# Patient Record
Sex: Male | Born: 1990 | Race: Black or African American | Hispanic: No | Marital: Single | Smoking: Current every day smoker
Health system: Southern US, Community
[De-identification: ages and names within clinical notes are randomized; demographics above are authoritative.]

## PROBLEM LIST (undated history)

## (undated) DIAGNOSIS — F909 Attention-deficit hyperactivity disorder, unspecified type: Secondary | ICD-10-CM

## (undated) DIAGNOSIS — F32A Depression, unspecified: Secondary | ICD-10-CM

## (undated) DIAGNOSIS — F319 Bipolar disorder, unspecified: Secondary | ICD-10-CM

## (undated) DIAGNOSIS — F419 Anxiety disorder, unspecified: Secondary | ICD-10-CM

## (undated) DIAGNOSIS — F329 Major depressive disorder, single episode, unspecified: Secondary | ICD-10-CM

## (undated) HISTORY — PX: WRIST SURGERY: SHX841

## (undated) HISTORY — PX: HAND TENDON SURGERY: SHX663

---
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
## 2009-12-10 ENCOUNTER — Emergency Department (HOSPITAL_COMMUNITY): Admission: EM | Admit: 2009-12-10 | Discharge: 2009-12-10 | Payer: Self-pay | Admitting: Emergency Medicine

## 2010-11-12 NOTE — H&P (Signed)
[facility]  Patient:    Connor Weber, Connor Weber                         MRN: [id number] Adm. Date:  [id number] Attending:  Veneta Penton                   Psychiatric Admission Assessment  DATE OF ADMISSION:  March 24, 2000  PATIENT IDENTIFICATION:  This [age]-year-old black male was admitted because of increased symptoms of depression over the past six months, along with increasing auditory hallucinations telling him to kill himself.  HISTORY OF PRESENT ILLNESS:  The patient reports that he hears the voice of the devil telling him that he is going to die or that he should go ahead and kill himself.  He reports that he is having increasing difficulty preventing himself from acting on those voices.  He admits to an increasingly depressed and irritable mood most of the day nearly every day.  He has been having increasingly assaultive behaviors towards siblings and towards his teachers at school.  He has been unable to perform at school.  His grades have been decreasing.  At home, he is completely unmanageable according to his mother. He admits to anhedonia, decreased appetite, feelings of hopelessness, helplessness, and worthlessness, initial and terminal insomnia, decreased concentration and energy level, increased symptoms of of fatigue, anxiety, psychomotor agitation, recurrent thoughts of death.  He denies any homicidal ideation, symptoms of mania.  He denies any other olfactory, tactile or visual hallucinations.  He denies any obsessions, compulsions, panic attacks, alcohol or drug abuse.  PAST PSYCHIATRIC HISTORY:  Significant for his being admitted to [facility] in 1999 with a supposed diagnosis of bipolar disorder, although the patient does not appear to meet criteria for that disorder.  He has a history of oppositional-defiant disorder and possibly conduct disorder.  There is no other psychiatric history available at this time.  His  medications at the time of admission include Adderall 10 mg p.o. b.i.d. and Depakote 500 mg p.o. b.i.d.  PAST MEDICAL HISTORY:  He has no known drug allergies or sensitivities.  THere is no other history of medical or surgical problems.  MENTAL STATUS EXAMINATION:  The patient presents as a well developed, well nourished black latency-age male who is alert, oriented x 4, responding to internal stimuli, psychomotor agitated, tremulous, with poor impulse control, decreased concentration and attention span.  His affect and mood are depressed and irritable.  His speech is coherent with a decreased rate and volume, his speech increase speech latency.  His immediate recall, short term memory and remote memory are intact.  His thought processes appear to be influenced by auditory hallucinations.  FAMILY AND SOCIAL HISTORY:  Significant for paternal grandmother, paternal aunt, and biological father having a history of schizophrenia.  Mother and stepfather are in the process of divorcing right now, but mother reports she does not feel the child is being significantly affected by this.  There is no other family history available at this time.  ADMISSION DIAGNOSES: Axis I:    1. Major depression, recurrent type, severe, with mood congruent               psychosis.            2. Attention deficit hyperactivity disorder, combined type.            3. Bipolar disorder, unlikely.  4. Rule out schizoaffective disorder.            5. Oppositional-defiant disorder.            6. Rule out conduct disorder. Axis II:   Rule out learning disorder not otherwise specified. Axis III:  None. Axis IV:   Severe. Axis V:    Code 20.  ASSETS AND STRENGTHS:  He has a supportive mother.  INITIAL PLAN OF CARE:  Discontinued Depakote and Adderall, begin the patient on a trial of Dexedrine spansules for his ADHD, and a trial of Remeron for depression.   Psychotherapy will focus on decreasing the  patients cognitive distortions, increasing his reality testing, decreasing potential for harm to self and others.  A laboratory work-up will also be initiated to rule out any medical problems contributing to his symptomatology.  ESTIMATED LENGTH OF STAY:  Five to seven days.  POST HOSPITAL CARE PLAN:  Initial discharge plan is to discharge the patient to home. DD:  03/24/00 TD:  03/24/00 Job: [id number]

## 2010-11-12 NOTE — Discharge Summary (Signed)
[facility]  Patient:    Connor Weber, PERALES                           MRN: [id number] Adm. Date:  [id number] Disc. Date: [id number] Attending:  Veneta Penton                           Discharge Summary  REASON FOR ADMISSION:  This 20 year old black male was admitted after tying an an electrical cord around his neck and attempting to hang himself.  For further History of Present Illness, please see the patients Psychiatric Admission Assessment.  PHYSICAL EXAMINATION:  His physical examination at the time of admission was entirely unremarkable.  LABORATORY DATA:  The patient underwent a laboratory workup to rule out any medical problems contributing to this symptomatology.  A urine drug screen was negative.  GGT was within normal limits.  A hepatic panel was unremarkable. CBC showed a hemoglobin of 10.6, hematocrit of 31.6, and was otherwise unremarkable.  A routine chemistry panel was within normal limits.  Thyroid function tests were within normal limits.  A UA was unremarkable.  The patient received no x-rays, no special procedures.  CONSULTATIONS:  None.  COMPLICATIONS:  He sustained no complications during the course of this hospitalization.  HOSPITAL COURSE:  The patient on admission was hyperactive, oppositional, defiant, irritable, angry, and depressed.  He was assaultive and aggressive at times and needed constant redirection within the milieu.  He was begun on a trial of Dexedrine spansules in combination with Effexor XR and Remeron.  He tolerated these medications well without any side effects.  At the time of discharge he denies any homicidal or suicidal ideation; affect and mood have improved; his concentration, attention span, and impulse control have improved both on the unit in group activities and in the classroom.  He denies any desire to harm himself or others.  As he is participating in all aspects of the therapeutic treatment  program, it is felt the patient has reached his maximum benefits of hospitalization and is ready for discharge to a less restrictive alternative setting.  CONDITION ON DISCHARGE:  Improved.  DISCHARGE DIAGNOSES ACCORDING TO DSM4: Axis I:    1. Major depression, recurrent type, severe without psychosis.            2. Attention deficit hyperactivity disorder, combined type.            3. Conduct disorder. Axis II:   Rule out learning disorder, not otherwise specified. Axis III:  None. Axis IV:   Severe. Axis V:    Code 10-20 on admission; code 30 on discharge.  FURTHER EVALUATION AND TREATMENT RECOMMENDATIONS: 1. The patient is discharged to the custody of his D.S.S. worker. 2. The patient is discharged on an unrestricted level of activity and a    regular diet.  DISCHARGE MEDICATIONS:  The patient is discharged on: 1. Remeron SolTabs 30 mg p.o. q.h.s. 2. Effexor XR 75 mg p.o. q.a.m. 3. Dexedrine spansules 30 mg p.o. q.a.m.  DISCHARGE FOLLOWUP:  He will follow up with Dr. Marlou Porch at [facility] for all further aspects of his psychiatric care, and consequently, I will sign off on the case at this time. DD:  07/11/00 TD:  07/11/00 Job: [id number]

## 2010-11-12 NOTE — Discharge Summary (Signed)
[facility]  Patient:    Connor Weber, Connor Weber                         MRN: [id number] Adm. Date:  [id number] Disc. Date: 03/31/00 Attending:  Veneta Penton                           Discharge Summary  REASON FOR ADMISSION:  This 20-year-old black male was admitted because of increasing symptoms of depression over the past six months along with increasing auditory hallucinations telling him to kill himself.  For further history of present illness, please see the patients psychiatric admission assessment.  His physical examination at the time of admission was entirely unremarkable.  LABORATORY EXAMINATION:  The patient underwent a laboratory work-up to rule out any medical problems contributing to his symptomatology.  A path panel was unremarkable.  UA was unremarkable.  Metabolic panel was within normal limits. A valproic acid level on admission was 91.2.  CBC showed a hemoglobin of 10.4, hematocrit of 31.1.  RDW of 14.2%.  Eosinophil count of 6%.  Thyroid function tests were within normal limits.  His ______ was within normal limits.  No x-rays, additional consultations or special procedures were performed.  The patient sustained no complications over the course of his hospitalization.  HOSPITAL COURSE:  The patient slowly adapted to unit routine.  He initially was responding to auditory hallucinations.  He was taken off of Depakote and Adderall due to the possibility that these might be contributing to his hallucinatory phenomena.  He was begun on a trial of Remeron at 30 mg p.o. q.h.s. for his depressive symptoms as well as a trial of Effexor.  These were titrated up to a therapeutic dosage and the patient responded well to them. For his ADHD symptoms the patient was switched to Dexedrine Spansules.  At the present time he is reporting no hallucinations.  He denies any homicidal or suicidal ideation.  His affect and mood is improved.  He reports  feeling better.  He is participating in all aspects of a therapeutic treatment program.  His activities of daily living have also improved.  Consequently, it is felt that the patient has reached his maximum benefits of hospitalization. He was also demonstrating a significant problem with attention deficit hyperactivity disorder that has also been improved on medication.  He denies any symptoms of psychosis or auditory or visual hallucinations at the present time.  CONDITION ON DISCHARGE:  Improved.  DIAGNOSES:  His diagnoses according to DSM-IV: Axis I:    1. Major depression, recurrent type severe with mood congruent               psychosis.            2. Attention deficit hyperactivity disorder, combined type.            3. Oppositional defiant disorder.            4. Rule out conduct disorder. Axis II:   Rule out learning disorder, not otherwise specified. Axis III:  None. Axis IV:   Severe. Axis V:    Code 20 on admission, code 30 on discharge.  FURTHER EVALUATION AND TREATMENT RECOMMENDATIONS: 1. The patient is discharged to home. 2. The patient is discharged to the custody of his parents. 3. The patient will follow up with his outpatient therapist, psychiatrist    for individual and family therapy and  all further aspects of his mental    health care including medication management.  DISCHARGE MEDICATIONS:  The patient is discharged on the following medications: 1. Dexedrine Spansules 30 mg p.o. q.a.m. 2. Remeron 30 mg p.o. q.h.s. 3. Effexor XR 75 mg p.o. q.a.m.  ACTIVITY/DIET:  The patient is discharged on an unrestricted level of activity and a regular diet. DD:  03/31/00 TD:  03/31/00 Job: [id number]
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
## 2010-11-16 ENCOUNTER — Emergency Department (HOSPITAL_COMMUNITY)
Admission: EM | Admit: 2010-11-16 | Discharge: 2010-11-16 | Disposition: A | Discharge status: Home / Self Care | Payer: Medicaid Other | Attending: Emergency Medicine | Admitting: Emergency Medicine

## 2010-11-16 DIAGNOSIS — R197 Diarrhea, unspecified: Secondary | ICD-10-CM | POA: Insufficient documentation

## 2010-11-16 DIAGNOSIS — F988 Other specified behavioral and emotional disorders with onset usually occurring in childhood and adolescence: Secondary | ICD-10-CM | POA: Insufficient documentation

## 2010-11-16 DIAGNOSIS — R112 Nausea with vomiting, unspecified: Secondary | ICD-10-CM | POA: Insufficient documentation

## 2010-11-16 DIAGNOSIS — F319 Bipolar disorder, unspecified: Secondary | ICD-10-CM | POA: Insufficient documentation

## 2010-11-27 ENCOUNTER — Inpatient Hospital Stay (INDEPENDENT_AMBULATORY_CARE_PROVIDER_SITE_OTHER)
Admission: RE | Admit: 2010-11-27 | Discharge: 2010-11-27 | Disposition: A | Discharge status: Home / Self Care | Payer: Medicaid Other | Source: Ambulatory Visit | Attending: Family Medicine | Admitting: Family Medicine

## 2010-11-27 DIAGNOSIS — R197 Diarrhea, unspecified: Secondary | ICD-10-CM

## 2010-11-27 DIAGNOSIS — R10819 Abdominal tenderness, unspecified site: Secondary | ICD-10-CM

## 2010-11-27 LAB — POCT I-STAT, CHEM 8
Calcium, Ion: 1.22 mmol/L (ref 1.12–1.32)
Chloride: 104 mEq/L (ref 96–112)
Creatinine, Ser: 1.3 mg/dL (ref 0.4–1.5)
Hemoglobin: 17.3 g/dL — ABNORMAL HIGH (ref 13.0–17.0)
Potassium: 4 mEq/L (ref 3.5–5.1)
Sodium: 140 mEq/L (ref 135–145)

## 2011-02-14 ENCOUNTER — Emergency Department (HOSPITAL_COMMUNITY): Payer: Medicaid Other

## 2011-02-14 ENCOUNTER — Emergency Department (HOSPITAL_COMMUNITY)
Admission: EM | Admit: 2011-02-14 | Discharge: 2011-02-14 | Disposition: A | Discharge status: Home / Self Care | Payer: Medicaid Other | Attending: Emergency Medicine | Admitting: Emergency Medicine

## 2011-02-14 DIAGNOSIS — M25539 Pain in unspecified wrist: Secondary | ICD-10-CM | POA: Insufficient documentation

## 2011-02-14 DIAGNOSIS — S59909A Unspecified injury of unspecified elbow, initial encounter: Secondary | ICD-10-CM | POA: Insufficient documentation

## 2011-02-14 DIAGNOSIS — S6990XA Unspecified injury of unspecified wrist, hand and finger(s), initial encounter: Secondary | ICD-10-CM | POA: Insufficient documentation

## 2011-02-14 DIAGNOSIS — S59919A Unspecified injury of unspecified forearm, initial encounter: Secondary | ICD-10-CM | POA: Insufficient documentation

## 2011-02-14 DIAGNOSIS — M21939 Unspecified acquired deformity of unspecified forearm: Secondary | ICD-10-CM | POA: Insufficient documentation

## 2011-02-14 DIAGNOSIS — M25439 Effusion, unspecified wrist: Secondary | ICD-10-CM | POA: Insufficient documentation

## 2011-02-14 DIAGNOSIS — F319 Bipolar disorder, unspecified: Secondary | ICD-10-CM | POA: Insufficient documentation

## 2011-02-14 DIAGNOSIS — S52599A Other fractures of lower end of unspecified radius, initial encounter for closed fracture: Secondary | ICD-10-CM | POA: Insufficient documentation

## 2011-02-14 DIAGNOSIS — F988 Other specified behavioral and emotional disorders with onset usually occurring in childhood and adolescence: Secondary | ICD-10-CM | POA: Insufficient documentation

## 2011-12-04 ENCOUNTER — Emergency Department (HOSPITAL_COMMUNITY): Payer: Medicaid Other

## 2011-12-04 ENCOUNTER — Encounter (HOSPITAL_COMMUNITY): Payer: Self-pay | Admitting: Emergency Medicine

## 2011-12-04 ENCOUNTER — Emergency Department (HOSPITAL_COMMUNITY)
Admission: EM | Admit: 2011-12-04 | Discharge: 2011-12-04 | Disposition: A | Discharge status: Home / Self Care | Payer: Medicaid Other | Attending: Emergency Medicine | Admitting: Emergency Medicine

## 2011-12-04 DIAGNOSIS — W219XXA Striking against or struck by unspecified sports equipment, initial encounter: Secondary | ICD-10-CM | POA: Insufficient documentation

## 2011-12-04 DIAGNOSIS — S8010XA Contusion of unspecified lower leg, initial encounter: Secondary | ICD-10-CM | POA: Insufficient documentation

## 2011-12-04 DIAGNOSIS — F17200 Nicotine dependence, unspecified, uncomplicated: Secondary | ICD-10-CM | POA: Insufficient documentation

## 2011-12-04 DIAGNOSIS — Y998 Other external cause status: Secondary | ICD-10-CM | POA: Insufficient documentation

## 2011-12-04 DIAGNOSIS — Y9361 Activity, american tackle football: Secondary | ICD-10-CM | POA: Insufficient documentation

## 2011-12-04 NOTE — Discharge Instructions (Signed)
Use Advil 400 mg 3 times a day for pain. Use the ace wrap for comfort. See the Dr. of your choice for problems.    Bone Bruise  A bone bruise is a small hidden fracture of the bone. It typically occurs with bones located close to the surface of the skin.  SYMPTOMS  The pain lasts longer than a normal bruise.   The bruised area is difficult to use.   There may be discoloration or swelling of the bruised area.   When a bone bruise is found with injury to the anterior cruciate ligament (in the knee) there is often an increased:   Amount of fluid in the knee   Time the fluid in the knee lasts.   Number of days until you are walking normally and regaining the motion you had before the injury.   Number of days with pain from the injury.  DIAGNOSIS  It can only be seen on X-rays known as MRIs. This stands for magnetic resonance imaging. A regular X-ray taken of a bone bruise would appear to be normal. A bone bruise is a common injury in the knee and the heel bone (calcaneus). The problems are similar to those produced by stress fractures, which are bone injuries caused by overuse. A bone bruise may also be a sign of other injuries. For example, bone bruises are commonly found where an anterior cruciate ligament (ACL) in the knee has been pulled away from the bone (ruptured). A ligament is a tough fibrous material that connects bones together to make our joints stable. Bruises of the bone last a lot longer than bruises of the muscle or tissues beneath the skin. Bone bruises can last from days to months and are often more severe and painful than other bruises. TREATMENT Because bone bruises are sudden injuries you cannot often prevent them, other than by being extremely careful. Some things you can do to improve the condition are:  Apply ice to the sore area for 15 to 20 minutes, 3 to 4 times per day while awake for the first 2 days. Put the ice in a plastic bag, and place a towel between the  bag of ice and your skin.   Keep your bruised area raised (elevated) when possible to lessen swelling.   For activity:   Use crutches when necessary; do not put weight on the injured leg until you are no longer tender.   You may walk on your affected part as the pain allows, or as instructed.   Start weight bearing gradually on the bruised part.   Continue to use crutches or a cane until you can stand without causing pain, or as instructed.   If a plaster splint was applied, wear the splint until you are seen for a follow-up examination. Rest it on nothing harder than a pillow the first 24 hours. Do not put weight on it. Do not get it wet. You may take it off to take a shower or bath.   If an air splint was applied, more air may be blown into or out of the splint as needed for comfort. You may take it off at night and to take a shower or bath.   Wiggle your toes in the splint several times per day if you are able.   You may have been given an elastic bandage to use with the plaster splint or alone. The splint is too tight if you have numbness, tingling or if your foot becomes  cold and blue. Adjust the bandage to make it comfortable.   Only take over-the-counter or prescription medicines for pain, discomfort, or fever as directed by your caregiver.   Follow all instructions for follow up with your caregiver. This includes any orthopedic referrals, physical therapy, and rehabilitation. Any delay in obtaining necessary care could result in a delay or failure of the bones to heal.  SEEK MEDICAL CARE IF:   You have an increase in bruising, swelling, or pain.   You notice coldness of your toes.   You do not get pain relief with medications.  SEEK IMMEDIATE MEDICAL CARE IF:   Your toes are numb or blue.   You have severe pain not controlled with medications.   If any of the problems that caused you to seek care are becoming worse.  Document Released: 09/03/2003 Document Revised:  06/02/2011 Document Reviewed: 01/16/2008 [facility] Patient Information 2012 [place], [place].   RESOURCE GUIDE  Chronic Pain Problems: Contact [facility]  [phone] Patients need to be referred by their primary care doctor.  Insufficient Money for Medicine: Contact United Way:  call "211" or [facility] 781-139-7997.  No Primary Care Doctor: - Call [facility] Connect  [phone] - can help you locate a primary care doctor that  accepts your insurance, provides certain services, etc. - Physician Referral Service- [phone]  Agencies that provide inexpensive medical care: - [facility]  [phone] - [facility]  [phone] - [facility]  [phone] - Women's Clinic  [phone] - Planned Parenthood  [phone] [facility]  [phone]  Medicaid-accepting [facility] Providers: - [facility]- [address]  [phone], Mon-Fri 9am-7pm, Sat 9am-1pm - [facility]- [address] [place], [address] [place]  [phone] - [facility]- [address], [address] [place]  [phone] [facility]- [address]  [phone] - Renaye Rakers- [address] [place], [address], [phone]  Only accepts [place] patients after they have their name  applied to their card  Self Pay (no insurance) in [place]: - Sickle Cell Patients: Dr Willey Blade, [facility]  [address] [place], [phone] - [facility]- [address] [place]  [phone]       [facility] [place]- [address]       -     [facility]- see information above (Speak to [employer] if you do not have insurance)       -  [facility]- [address] [place], [phone]       -  [facility]- [address],  [phone]       -  [facility]- [address] [phone]       -  Dr  Julio Sicks-  [address], [address], [place], [phone]       -  [facility]- [address], [phone]       -  [facility]- [address] [phone], also [address], [phone]       -    [facility]- [address] [place], [phone], 1st & 3rd Saturday   every month, 10am-1pm  1) Find a Doctor and Pay Out of Pocket Although you won't have to find  out who is covered by your insurance plan, it is a good idea to ask around and get recommendations. You will then need to call the office and see if the doctor you have chosen will accept you as a new patient and what types of options they offer for patients who are self-pay. Some doctors offer discounts or will set up payment plans for their patients who do not have insurance, but you will need to ask so you aren't surprised when you get to your appointment.  2) Contact Your Local Health Department Not all health departments have doctors that can see patients for sick visits, but many do, so it is worth a call to see if yours does. If you don't know where your local health department is, you can check in your phone book. The CDC also has a tool to help you locate your state's health department, and many state websites also have listings of all of their local health departments.  3) Find a Walk-in Clinic If your illness is not likely to be very severe or complicated, you may want to try a walk in clinic. These are popping up all over the country in pharmacies, drugstores, and shopping centers. They're usually staffed by nurse practitioners or physician assistants that have been trained to treat common illnesses and complaints. They're usually fairly quick and inexpensive. However, if you have serious medical issues or chronic medical problems, these are probably not your best option  STD Testing - [facility] Department of [facility], STD Clinic, [address] [place], phone  [phone] or [phone].  Monday - Friday, call for an appointment. [facility] Department of [employer], STD Clinic, [address], [place], phone [phone] or [phone].  Monday - Friday, call for an appointment.  Abuse/Neglect: [facility] Child Abuse Hotline 873-340-0169 [facility] Child Abuse Hotline 2230258400 (After Hours)  Emergency Shelter:  [facility] (226)398-6977  Maternity Homes: - Room at the [place] [facility] [phone] - [facility] ([phone]  MRSA Hotline #:   214 409 6320  [facility] Resources  [facility] [place]  United Way [facility] Dept. [address]                 [address]         [address] [place] [address]  Blondell Reveal Phone:  [phone]                                  Phone:  [phone]                   Phone:  [phone]  [facility] Mental Health, [phone] - [phone]       -     [facility] in [place], [address],  [phone], [facility] Child Abuse Hotline 408-478-8717 or (657) 231-6506 (After Hours)   Behavioral Health Services  Substance Abuse Resources: - [facility] Drug Services  850-376-7022 - [facility] 567-398-6465 - [facility] [place] [phone] Floydene Flock [phone] - Residential & Outpatient Substance Abuse Program  [phone]  Psychological Services: [facility]  [phone] [facility] Services  [phone] - [facility], [phone] [place]. [address], [place], ACCESS LINE: [phone] or 220-184-1902, [employer]  Dental Assistance  If unable to pay or uninsured, contact:  [facility] or [facility]. to become qualified for the adult dental clinic.  Patients with Medicaid: [facility] [phone] [address], [phone] [address] [phone]  If unable to pay, or uninsured, contact [facility] [phone]) or [facility] Department [phone] in [place], [phone] in [facility]) to become qualified for the adult dental clinic  Other Low-Cost Community Dental Services: - [facility]- [address] [place], [place], [place], [address], [phone], [phone], 2nd and 4th Thursday of the month at 6:30am.  10 clients each day by appointment, can sometimes see walk-in patients if someone does not show for an appointment. [facility]- [address] [place], [place], [address], [phone] - [facility]- [address] [place], [place], [address], [phone] - [facility]- [phone] [facility] Department- [phone] [facility] Department- [phone]

## 2011-12-04 NOTE — ED Notes (Signed)
Pt c/o right ankle/shin pain onset Friday while playing football.

## 2011-12-04 NOTE — ED Provider Notes (Signed)
History   This chart was scribed for Flint Melter, MD scribed by Magnus Sinning. The patient was seen in room STRE3/STRE3 seen at 11:52    CSN: [id number]  Arrival date & time 12/04/11  1021   First MD Initiated Contact with Patient 12/04/11 1112      Chief Complaint  Patient presents with  . Leg Pain    right leg    (Consider location/radiation/quality/duration/timing/severity/associated sxs/prior treatment) HPI Connor Weber is a 21 y.o. male who presents to the Emergency Department complaining of constant moderate ankle pain, onset 3 days. Pt says he injured his ankle playing football, detailing that someone hit it. Patient states that he is ambulatory with pain and that only treatment tried was a bath salt soak with no relief. Pt says when he got up this morning and stood up he experienced a shooting pain into leg, which prompted him to come be seen in the ED.  History reviewed. No pertinent past medical history.  Past Surgical History  Procedure Date  . Wrist surgery     History reviewed. No pertinent family history.  History  Substance Use Topics  . Smoking status: Current Everyday Smoker  . Smokeless tobacco: Not on file  . Alcohol Use: Yes      Review of Systems  Allergies  Review of patient's allergies indicates no known allergies.  Home Medications   Current Outpatient Rx  Name Route Sig Dispense Refill  . LISDEXAMFETAMINE DIMESYLATE 30 MG PO CAPS Oral Take 30 mg by mouth daily as needed. When angry to calm down    . PRESCRIPTION MEDICATION Oral Take 1 tablet by mouth daily. For adhd and bipolar ( on 2 different meds)      BP 120/67  Pulse 67  Temp(Src) 97.7 F (36.5 C) (Oral)  Resp 14  SpO2 100%  Physical Exam  Nursing note and vitals reviewed. Constitutional: He is oriented to person, place, and time. He appears well-developed and well-nourished. No distress.  HENT:  Head: Normocephalic and atraumatic.  Eyes: Conjunctivae and EOM are normal.    Neck: Neck supple. No tracheal deviation present.  Cardiovascular: Normal rate.   Pulmonary/Chest: Effort normal. No respiratory distress.  Abdominal: He exhibits no distension.  Musculoskeletal: Normal range of motion. He exhibits no edema.       Tenderness of the distal right tibia about 10 cm above the ankle. No redness swelling or deformity. Intact distal sensation and circulation.   Neurological: He is alert and oriented to person, place, and time. No sensory deficit.  Skin: Skin is warm and dry.  Psychiatric: He has a normal mood and affect. His behavior is normal.    ED Course  Procedures (including critical care time) DIAGNOSTIC STUDIES: Oxygen Saturation is 100% on room air, normal by my interpretation.    COORDINATION OF CARE: Ace wrap given in emergency    Labs Reviewed - No data to display Dg Tibia/fibula Right  12/04/2011  *RADIOLOGY REPORT*  Clinical Data: Injured right leg 2 days ago playing football.  Pain distal tibia-fibula.  RIGHT TIBIA AND FIBULA - 2 VIEW  Comparison: None.  Findings: There is some soft tissue swelling anteriorly at the level of the lower third of the distal tibia and fibula.  No acute or healing fracture is identified.  IMPRESSION: Mild soft tissue swelling anterior to the lower tibia-fibula.  No acute bony abnormality.  Original Report Authenticated By: Britta Mccreedy, M.D.     1. Contusion, lower leg  MDM  Evaluation is consistent with bone contusion, doubt sprain, fracture, or neuropathy.   I personally performed the services described in this documentation, which was scribed in my presence. The recorded information has been reviewed and considered.      Plan: Home Medications- Advil prn; Home Treatments- ACE for comfort; Recommended follow up- PCP prn    Flint Melter, MD 12/04/11 [phone]

## 2011-12-13 ENCOUNTER — Encounter (HOSPITAL_COMMUNITY): Payer: Self-pay | Admitting: Emergency Medicine

## 2011-12-13 ENCOUNTER — Emergency Department (HOSPITAL_COMMUNITY)
Admission: EM | Admit: 2011-12-13 | Discharge: 2011-12-13 | Disposition: A | Discharge status: Home / Self Care | Payer: Medicaid Other | Source: Home / Self Care | Attending: Emergency Medicine | Admitting: Emergency Medicine

## 2011-12-13 DIAGNOSIS — Z2089 Contact with and (suspected) exposure to other communicable diseases: Secondary | ICD-10-CM

## 2011-12-13 DIAGNOSIS — Z202 Contact with and (suspected) exposure to infections with a predominantly sexual mode of transmission: Secondary | ICD-10-CM

## 2011-12-13 DIAGNOSIS — L259 Unspecified contact dermatitis, unspecified cause: Secondary | ICD-10-CM

## 2011-12-13 DIAGNOSIS — L309 Dermatitis, unspecified: Secondary | ICD-10-CM

## 2011-12-13 DIAGNOSIS — L738 Other specified follicular disorders: Secondary | ICD-10-CM

## 2011-12-13 HISTORY — DX: Bipolar disorder, unspecified: F31.9

## 2011-12-13 HISTORY — DX: Attention-deficit hyperactivity disorder, unspecified type: F90.9

## 2011-12-13 MED ORDER — AZITHROMYCIN 250 MG PO TABS
ORAL_TABLET | ORAL | Status: AC
  Filled 2011-12-13: qty 4

## 2011-12-13 MED ORDER — CEFTRIAXONE SODIUM 250 MG IJ SOLR
250.0000 mg | Freq: Once | INTRAMUSCULAR | Status: AC
  Administered 2011-12-13: 250 mg via INTRAMUSCULAR

## 2011-12-13 MED ORDER — AZITHROMYCIN 250 MG PO TABS
1000.0000 mg | ORAL_TABLET | Freq: Every day | ORAL | Status: DC
  Administered 2011-12-13: 1000 mg via ORAL

## 2011-12-13 MED ORDER — CEFTRIAXONE SODIUM 250 MG IJ SOLR
INTRAMUSCULAR | Status: AC
  Filled 2011-12-13: qty 250

## 2011-12-13 NOTE — ED Provider Notes (Signed)
Medical screening examination/treatment/procedure(s) were performed by non-physician practitioner and as supervising physician I was immediately available for consultation/collaboration.  Raynald Blend, MD 12/13/11 [phone]

## 2011-12-13 NOTE — ED Notes (Addendum)
Pt here with c/o pus filled pimple to penis that started 2 weeks ago with irritation.denies d/c or burning.unsure of exposure

## 2011-12-13 NOTE — ED Provider Notes (Signed)
History     CSN: [id number]  Arrival date & time 12/13/11  1115   None     Chief Complaint  Patient presents with  . Exposure to STD    (Consider location/radiation/quality/duration/timing/severity/associated sxs/prior treatment) HPI Comments: Pt noticed white spot on penis that looked like a pimple 2 days ago, squeezed it and it got worse, now is getting better.    Also c/o itchy rash to hands and arms for several weeks, mother says it's eczema.  Has had before, gets seasonally.      Patient is a 21 y.o. male presenting with rash. The history is provided by the patient.  Rash  This is a new problem. The current episode started 2 days ago. The problem has been gradually improving. There has been no fever. The rash is present on the genitalia. The pain is at a severity of 0/10. Pertinent negatives include no pain and no weeping. He has tried nothing for the symptoms.    Past Medical History  Diagnosis Date  . Bipolar 1 disorder   . ADHD (attention deficit hyperactivity disorder)   . Manic, depressive     History reviewed. No pertinent past surgical history.  History reviewed. No pertinent family history.  History  Substance Use Topics  . Smoking status: Never Smoker   . Smokeless tobacco: Not on file  . Alcohol Use: Yes      Review of Systems  Constitutional: Negative for fever and chills.  Gastrointestinal: Negative for nausea, vomiting and abdominal pain.  Genitourinary: Positive for genital sores. Negative for dysuria, frequency, flank pain, discharge, penile swelling, scrotal swelling, penile pain and testicular pain.  Skin: Positive for rash.    Allergies  Review of patient's allergies indicates no known allergies.  Home Medications  No current outpatient prescriptions on file.  BP 129/76  Pulse 76  Temp 97.6 F (36.4 C) (Oral)  Resp 14  SpO2 97%  Physical Exam  Constitutional: He appears well-developed and well-nourished. No distress.    Cardiovascular: Normal rate and regular rhythm.   Pulmonary/Chest: Effort normal and breath sounds normal.  Abdominal: Soft. He exhibits no distension. There is no tenderness.  Genitourinary: Testes normal. Circumcised. No penile tenderness. No discharge found.       See skin exam  Lymphadenopathy:       Right: No inguinal adenopathy present.       Left: No inguinal adenopathy present.  Skin: Skin is warm and dry. Rash noted.       Patches dry skin, papules on B hands and forearms c/w eczema- none on palmar surfaces.  Small 2-41mm papule R lateral penis, 1/3 way up shaft of penis from base- nontender to palp, appears to be healing, c/w pseudofolliculitis.      ED Course  Procedures (including critical care time)   Labs Reviewed  GC/CHLAMYDIA PROBE AMP, GENITAL  RPR   No results found.   1. Pseudofolliculitis   2. Exposure to sexually transmitted disease (STD)   3. Eczema       MDM  Will test pt for gc/chlamydia and rpr.  Given pt's description of his sexual activity, he is at risk for STDs.  Will empirically tx for gc/chlamydia.  Discussed safer sex practices with pt.  Discussed smoking cessation with pt.         Cathlyn Parsons, NP 12/13/11 1246

## 2011-12-13 NOTE — ED Notes (Signed)
No reaction post im injection

## 2011-12-13 NOTE — Discharge Instructions (Signed)
Use condoms when you are having sex to reduce the risk of getting a STD.  To get tested for HIV, go to the Health Department.   Try Claritin (generic version "loratidine" is ok to use) once a day for your eczema.  Also use a really good lotion/cream on your skin to help keep from itching and protect your skin.    Stop smoking! You can do it.  It's so important for your health to stop smoking.    Eczema Atopic dermatitis, or eczema, is an inherited type of sensitive skin. Often people with eczema have a family history of allergies, asthma, or hay fever. It causes a red itchy rash and dry scaly skin. The itchiness may occur before the skin rash and may be very intense. It is not contagious. Eczema is generally worse during the cooler winter months and often improves with the warmth of summer. Eczema usually starts showing signs in infancy. Some children outgrow eczema, but it may last through adulthood. Flare-ups may be caused by:  Eating something or contact with something you are sensitive or allergic to.   Stress.  DIAGNOSIS  The diagnosis of eczema is usually based upon symptoms and medical history. TREATMENT  Eczema cannot be cured, but symptoms usually can be controlled with treatment or avoidance of allergens (things to which you are sensitive or allergic to).  Controlling the itching and scratching.   Use over-the-counter antihistamines as directed for itching. It is especially useful at night when the itching tends to be worse.   Use over-the-counter steroid creams as directed for itching.   Scratching makes the rash and itching worse and may cause impetigo (a skin infection) if fingernails are contaminated (dirty).   Keeping the skin well moisturized with creams every day. This will seal in moisture and help prevent dryness. Lotions containing alcohol and water can dry the skin and are not recommended.   Limiting exposure to allergens.   Recognizing situations that cause stress.     Developing a plan to manage stress.  HOME CARE INSTRUCTIONS   Take prescription and over-the-counter medicines as directed by your caregiver.   Do not use anything on the skin without checking with your caregiver.   Keep baths or showers short (5 minutes) in warm (not hot) water. Use mild cleansers for bathing. You may add non-perfumed bath oil to the bath water. It is best to avoid soap and bubble bath.   Immediately after a bath or shower, when the skin is still damp, apply a moisturizing ointment to the entire body. This ointment should be a petroleum ointment. This will seal in moisture and help prevent dryness. The thicker the ointment the better. These should be unscented.   Keep fingernails cut short and wash hands often. If your child has eczema, it may be necessary to put soft gloves or mittens on your child at night.   Dress in clothes made of cotton or cotton blends. Dress lightly, as heat increases itching.   Avoid foods that may cause flare-ups. Common foods include cow's milk, peanut butter, eggs and wheat.   Keep a child with eczema away from anyone with fever blisters. The virus that causes fever blisters (herpes simplex) can cause a serious skin infection in children with eczema.  SEEK MEDICAL CARE IF:   Itching interferes with sleep.   The rash gets worse or is not better within one week following treatment.   The rash looks infected (pus or soft yellow scabs).  You or your child has an oral temperature above 102 F (38.9 C).   Your baby is older than 3 months with a rectal temperature of 100.5 F (38.1 C) or higher for more than 1 day.   The rash flares up after contact with someone who has fever blisters.  SEEK IMMEDIATE MEDICAL CARE IF:   Your baby is older than 3 months with a rectal temperature of 102 F (38.9 C) or higher.   Your baby is older than 3 months or younger with a rectal temperature of 100.4 F (38 C) or higher.  Document Released:  06/10/2000 Document Revised: 06/02/2011 Document Reviewed: 04/15/2009 [facility] Patient Information 2012 [employer], [place]. Eczema Atopic dermatitis, or eczema, is an inherited type of sensitive skin. Often people with eczema have a family history of allergies, asthma, or hay fever. It causes a red itchy rash and dry scaly skin. The itchiness may occur before the skin rash and may be very intense. It is not contagious. Eczema is generally worse during the cooler winter months and often improves with the warmth of summer. Eczema usually starts showing signs in infancy. Some children outgrow eczema, but it may last through adulthood. Flare-ups may be caused by:  Eating something or contact with something you are sensitive or allergic to.   Stress.  DIAGNOSIS  The diagnosis of eczema is usually based upon symptoms and medical history. TREATMENT  Eczema cannot be cured, but symptoms usually can be controlled with treatment or avoidance of allergens (things to which you are sensitive or allergic to).  Controlling the itching and scratching.   Use over-the-counter antihistamines as directed for itching. It is especially useful at night when the itching tends to be worse.   Use over-the-counter steroid creams as directed for itching.   Scratching makes the rash and itching worse and may cause impetigo (a skin infection) if fingernails are contaminated (dirty).   Keeping the skin well moisturized with creams every day. This will seal in moisture and help prevent dryness. Lotions containing alcohol and water can dry the skin and are not recommended.   Limiting exposure to allergens.   Recognizing situations that cause stress.   Developing a plan to manage stress.  HOME CARE INSTRUCTIONS   Take prescription and over-the-counter medicines as directed by your caregiver.   Do not use anything on the skin without checking with your caregiver.   Keep baths or showers short (5 minutes) in warm (not  hot) water. Use mild cleansers for bathing. You may add non-perfumed bath oil to the bath water. It is best to avoid soap and bubble bath.   Immediately after a bath or shower, when the skin is still damp, apply a moisturizing ointment to the entire body. This ointment should be a petroleum ointment. This will seal in moisture and help prevent dryness. The thicker the ointment the better. These should be unscented.   Keep fingernails cut short and wash hands often. If your child has eczema, it may be necessary to put soft gloves or mittens on your child at night.   Dress in clothes made of cotton or cotton blends. Dress lightly, as heat increases itching.   Avoid foods that may cause flare-ups. Common foods include cow's milk, peanut butter, eggs and wheat.   Keep a child with eczema away from anyone with fever blisters. The virus that causes fever blisters (herpes simplex) can cause a serious skin infection in children with eczema.  SEEK MEDICAL CARE IF:  Itching interferes with sleep.   The rash gets worse or is not better within one week following treatment.   The rash looks infected (pus or soft yellow scabs).   You or your child has an oral temperature above 102 F (38.9 C).   Your baby is older than 3 months with a rectal temperature of 100.5 F (38.1 C) or higher for more than 1 day.   The rash flares up after contact with someone who has fever blisters.  SEEK IMMEDIATE MEDICAL CARE IF:   Your baby is older than 3 months with a rectal temperature of 102 F (38.9 C) or higher.   Your baby is older than 3 months or younger with a rectal temperature of 100.4 F (38 C) or higher.  Document Released: 06/10/2000 Document Revised: 06/02/2011 Document Reviewed: 04/15/2009 [facility] Patient Information 2012 [place], [place].

## 2011-12-14 LAB — GC/CHLAMYDIA PROBE AMP, GENITAL
Chlamydia, DNA Probe: NEGATIVE
GC Probe Amp, Genital: NEGATIVE

## 2011-12-14 LAB — RPR: RPR Ser Ql: NONREACTIVE

## 2012-06-15 ENCOUNTER — Encounter (HOSPITAL_COMMUNITY): Payer: Self-pay | Admitting: *Deleted

## 2012-06-15 ENCOUNTER — Emergency Department (HOSPITAL_COMMUNITY)
Admission: EM | Admit: 2012-06-15 | Discharge: 2012-06-15 | Disposition: A | Discharge status: Home / Self Care | Payer: Medicaid Other | Attending: Emergency Medicine | Admitting: Emergency Medicine

## 2012-06-15 DIAGNOSIS — M2550 Pain in unspecified joint: Secondary | ICD-10-CM | POA: Insufficient documentation

## 2012-06-15 DIAGNOSIS — F319 Bipolar disorder, unspecified: Secondary | ICD-10-CM | POA: Insufficient documentation

## 2012-06-15 DIAGNOSIS — F17200 Nicotine dependence, unspecified, uncomplicated: Secondary | ICD-10-CM | POA: Insufficient documentation

## 2012-06-15 DIAGNOSIS — B079 Viral wart, unspecified: Secondary | ICD-10-CM

## 2012-06-15 DIAGNOSIS — L989 Disorder of the skin and subcutaneous tissue, unspecified: Secondary | ICD-10-CM

## 2012-06-15 DIAGNOSIS — F909 Attention-deficit hyperactivity disorder, unspecified type: Secondary | ICD-10-CM | POA: Insufficient documentation

## 2012-06-15 DIAGNOSIS — R21 Rash and other nonspecific skin eruption: Secondary | ICD-10-CM | POA: Insufficient documentation

## 2012-06-15 NOTE — ED Provider Notes (Signed)
History   This chart was scribed for Derwood Kaplan, MD by Charolett Bumpers, ED Scribe. The patient was seen in room TR09C/TR09C. Patient's care was started at 1105.   CSN: [id number]  Arrival date & time 06/15/12  1027   First MD Initiated Contact with Patient 06/15/12 1105      Chief Complaint  Patient presents with  . Hand Pain    The history is provided by the patient. No language interpreter was used.   Connor Weber is a 21 y.o. male who presents to the Emergency Department complaining of mild left hand pain that started a week ago. Pt noted a "callus like formation" 1 week ago on his left index finger, and didn't think much of it. The lesion is painful only when pressure is applied. He states his symptoms have gradually worsened and didn't start bothering him until a couple of days ago. He states he used a needle and opened up an area of callous on his left pinky. He states he then noticed similar areas to his left palm and left ring finger. He denies any h/o similar infection. He denies any exposures to chemicals that could have caused. He denies any h/o skin disorders, m-s disordered or autoimmune conditions. Pt is right handed.   Past Medical History  Diagnosis Date  . Bipolar 1 disorder   . ADHD (attention deficit hyperactivity disorder)   . Manic, depressive     History reviewed. No pertinent past surgical history.  No family history on file.  History  Substance Use Topics  . Smoking status: Current Every Day Smoker  . Smokeless tobacco: Not on file  . Alcohol Use: Yes      Review of Systems  Constitutional: Negative for activity change.  Musculoskeletal: Positive for arthralgias.       Left hand pain.  Skin: Positive for rash.  Neurological: Negative for numbness.  Hematological: Does not bruise/bleed easily.  All other systems reviewed and are negative.    Allergies  Review of patient's allergies indicates no known allergies.  Home Medications   No current outpatient prescriptions on file.  BP 123/70  Pulse 80  Temp 97.7 F (36.5 C) (Oral)  SpO2 100%  Physical Exam  Nursing note and vitals reviewed. Constitutional: He is oriented to person, place, and time. He appears well-developed and well-nourished. No distress.  HENT:  Head: Normocephalic and atraumatic.  Eyes: EOM are normal.  Neck: Neck supple. No tracheal deviation present.  Pulmonary/Chest: Effort normal.  Musculoskeletal: Normal range of motion. He exhibits tenderness.       Base of left pinky, pt has a callous like growth. No abscess or fluctuance. Tenderness to palpation noted. Sensory exam normal. Able to flex and extend at all IP joints normally. Similar growth also noted a mid left palm and left ring finger.    Neurological: He is alert and oriented to person, place, and time.  Skin: Skin is warm and dry.  Psychiatric: He has a normal mood and affect. His behavior is normal.    ED Course  Procedures (including critical care time)  DIAGNOSTIC STUDIES: Oxygen Saturation is 100% on room air, normal by my interpretation.    COORDINATION OF CARE:  11:25-Discussed planned course of treatment with the patient, who is agreeable at this time.     Labs Reviewed - No data to display No results found.   No diagnosis found.    MDM  Medical screening examination/treatment/procedure(s) were performed by me as the supervising  physician. Scribe service was utilized for documentation only.  Pt comes in with cc of callus like lesion to his left pinky/ The exam is consistent with callus like skin hypertrophy, tenderness to palpation. The lesion is not associated underlying tendon, or the bone. There is no hx of cancers, autoimmune conditions or skin conditions - no allergens The same lesion appeared over the other fingers.  I am not sure what this is..... Doesn't look like a herpetic whitlow - but i am going to advocate similar management - with warm  pressures and good hygiene.     Derwood Kaplan, MD 06/15/12 [phone]

## 2012-06-15 NOTE — ED Notes (Signed)
Hand pain x 1 week; difficult bend fingers and hurts when applying pressure. Can make a fist

## 2012-10-06 ENCOUNTER — Emergency Department (HOSPITAL_COMMUNITY): Payer: Medicaid Other

## 2012-10-06 ENCOUNTER — Encounter (HOSPITAL_COMMUNITY): Payer: Self-pay | Admitting: *Deleted

## 2012-10-06 ENCOUNTER — Emergency Department (HOSPITAL_COMMUNITY)
Admission: EM | Admit: 2012-10-06 | Discharge: 2012-10-06 | Disposition: A | Discharge status: Home / Self Care | Payer: Medicaid Other | Attending: Emergency Medicine | Admitting: Emergency Medicine

## 2012-10-06 DIAGNOSIS — Y9389 Activity, other specified: Secondary | ICD-10-CM | POA: Insufficient documentation

## 2012-10-06 DIAGNOSIS — W1809XA Striking against other object with subsequent fall, initial encounter: Secondary | ICD-10-CM | POA: Insufficient documentation

## 2012-10-06 DIAGNOSIS — Y929 Unspecified place or not applicable: Secondary | ICD-10-CM | POA: Insufficient documentation

## 2012-10-06 DIAGNOSIS — F17200 Nicotine dependence, unspecified, uncomplicated: Secondary | ICD-10-CM | POA: Insufficient documentation

## 2012-10-06 DIAGNOSIS — F909 Attention-deficit hyperactivity disorder, unspecified type: Secondary | ICD-10-CM | POA: Insufficient documentation

## 2012-10-06 DIAGNOSIS — S63509A Unspecified sprain of unspecified wrist, initial encounter: Secondary | ICD-10-CM | POA: Insufficient documentation

## 2012-10-06 DIAGNOSIS — S63502A Unspecified sprain of left wrist, initial encounter: Secondary | ICD-10-CM

## 2012-10-06 DIAGNOSIS — F319 Bipolar disorder, unspecified: Secondary | ICD-10-CM | POA: Insufficient documentation
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
## 2012-10-07 NOTE — ED Provider Notes (Signed)
Medical screening examination/treatment/procedure(s) were performed by non-physician practitioner and as supervising physician I was immediately available for consultation/collaboration.   Glynn Octave, MD 10/07/12 1440

## 2012-11-28 ENCOUNTER — Emergency Department (HOSPITAL_COMMUNITY)
Admission: EM | Admit: 2012-11-28 | Discharge: 2012-11-28 | Disposition: A | Discharge status: Home / Self Care | Payer: Medicaid Other | Attending: Emergency Medicine | Admitting: Emergency Medicine

## 2012-11-28 ENCOUNTER — Encounter (HOSPITAL_COMMUNITY): Payer: Self-pay | Admitting: *Deleted

## 2012-11-28 ENCOUNTER — Emergency Department (HOSPITAL_COMMUNITY): Payer: Medicaid Other

## 2012-11-28 DIAGNOSIS — S61209A Unspecified open wound of unspecified finger without damage to nail, initial encounter: Secondary | ICD-10-CM | POA: Insufficient documentation

## 2012-11-28 DIAGNOSIS — Y9389 Activity, other specified: Secondary | ICD-10-CM | POA: Insufficient documentation

## 2012-11-28 DIAGNOSIS — S61432A Puncture wound without foreign body of left hand, initial encounter: Secondary | ICD-10-CM

## 2012-11-28 DIAGNOSIS — Y9289 Other specified places as the place of occurrence of the external cause: Secondary | ICD-10-CM | POA: Insufficient documentation

## 2012-11-28 DIAGNOSIS — F17200 Nicotine dependence, unspecified, uncomplicated: Secondary | ICD-10-CM | POA: Insufficient documentation

## 2012-11-28 DIAGNOSIS — F909 Attention-deficit hyperactivity disorder, unspecified type: Secondary | ICD-10-CM | POA: Insufficient documentation

## 2012-11-28 DIAGNOSIS — F319 Bipolar disorder, unspecified: Secondary | ICD-10-CM | POA: Insufficient documentation

## 2012-11-28 DIAGNOSIS — W278XXA Contact with other nonpowered hand tool, initial encounter: Secondary | ICD-10-CM | POA: Insufficient documentation

## 2012-11-28 DIAGNOSIS — Z23 Encounter for immunization: Secondary | ICD-10-CM | POA: Insufficient documentation

## 2012-11-28 MED ORDER — HYDROCODONE-ACETAMINOPHEN 5-325 MG PO TABS: 1.0000 | ORAL_TABLET | ORAL | Status: DC | PRN

## 2012-11-28 MED ORDER — CEPHALEXIN 500 MG PO CAPS: 500.0000 mg | ORAL_CAPSULE | Freq: Four times a day (QID) | ORAL | Status: DC

## 2012-11-28 MED ORDER — TETANUS-DIPHTH-ACELL PERTUSSIS 5-2.5-18.5 LF-MCG/0.5 IM SUSP
0.5000 mL | Freq: Once | INTRAMUSCULAR | Status: AC
  Administered 2012-11-28: 0.5 mL via INTRAMUSCULAR
  Filled 2012-11-28: qty 0.5

## 2012-11-28 NOTE — ED Notes (Signed)
Pt states at 10am this morning he was trying to fix a door henge and the screw driver cut his hand. Pt states that he tried to put peroxcide on the wound but unable to get the wound to close. Pt has unknown last tetnaus

## 2012-11-28 NOTE — ED Provider Notes (Signed)
History     CSN: [id number]  Arrival date & time 11/28/12  2150   First MD Initiated Contact with Patient 11/28/12 2208      Chief Complaint  Patient presents with  . Hand Injury   HPI  Hx provided by pt.  Pt is a 22 yo male with no significant PMH who presents with complaints of hand injury and pain.  Pt states he was using a flat head screw driver to fix a door hinge when it slipped and hit his left hand and thumb area.  Injury occurred earlier in the day.  Pt was doing ok and continued to work but reports having recurring bleeding as well as increased swelling and pain.  He denies any weakness or numbness.  No reduced ROM.  He is unsure of last tetanus. No other aggravating or alleviating factors. No other associated symptoms.     Past Medical History  Diagnosis Date  . Bipolar 1 disorder   . ADHD (attention deficit hyperactivity disorder)   . Manic, depressive     Past Surgical History  Procedure Laterality Date  . Hand surgery      left    History reviewed. No pertinent family history.  History  Substance Use Topics  . Smoking status: Current Every Day Smoker  . Smokeless tobacco: Not on file  . Alcohol Use: Yes      Review of Systems  Neurological: Negative for weakness and numbness.  All other systems reviewed and are negative.    Allergies  Review of patient's allergies indicates no known allergies.  Home Medications   Current Outpatient Rx  Name  Route  Sig  Dispense  Refill  . ARIPiprazole (ABILIFY) 10 MG tablet   Oral   Take 10 mg by mouth daily.         Marland Kitchen atomoxetine (STRATTERA) 80 MG capsule   Oral   Take 80 mg by mouth daily.         . hydrOXYzine (VISTARIL) 25 MG capsule   Oral   Take 25 mg by mouth daily.           BP 136/67  Pulse 73  Temp(Src) 98.3 F (36.8 C) (Oral)  Resp 16  SpO2 99%  Physical Exam  Nursing note and vitals reviewed. Constitutional: He is oriented to person, place, and time. He appears  well-developed and well-nourished. No distress.  HENT:  Head: Normocephalic.  Cardiovascular: Normal rate and regular rhythm.   Pulmonary/Chest: Effort normal and breath sounds normal. No respiratory distress.  Abdominal: Soft.  Musculoskeletal: Normal range of motion. He exhibits edema and tenderness.  Single puncture wound to the base of left dorsal thumb.  No active bleeding.  Mild surrounding swelling.  TTP.  Normal strength in all directions of thumb.  Normal distal sensations.  Neurological: He is alert and oriented to person, place, and time.  Skin: Skin is warm and dry. No erythema.  Psychiatric: He has a normal mood and affect. His behavior is normal.    ED Course  Procedures      Dg Hand Complete Left  11/28/2012   *RADIOLOGY REPORT*  Clinical Data: Penetrating trauma to the distal thumb  LEFT HAND - COMPLETE 3+ VIEW  Comparison: Hand radiograph 10/06/2012  Findings: No fracture or dislocation.  No soft tissue abnormality. No radiopaque foreign body.  IMPRESSION: Normal exam.   Original Report Authenticated By: Christiana Pellant, M.D.     1. Puncture wound of hand, left, initial encounter  MDM  Pt seen and evaluated.  Pt appears well.  Bleeding controled.  Pt given tetanus shot.       Angus Seller, PA-C 11/28/12 2359

## 2012-11-29 ENCOUNTER — Telehealth (HOSPITAL_COMMUNITY): Payer: Self-pay | Admitting: Emergency Medicine

## 2012-11-29 NOTE — ED Provider Notes (Signed)
Medical screening examination/treatment/procedure(s) were performed by non-physician practitioner and as supervising physician I was immediately available for consultation/collaboration.  Geoffery Lyons, MD 11/29/12 [phone]

## 2012-11-29 NOTE — ED Notes (Signed)
Hand MD called and requested name of MD who referred patient. °

## 2013-03-18 ENCOUNTER — Encounter (HOSPITAL_COMMUNITY): Payer: Self-pay

## 2013-03-18 ENCOUNTER — Emergency Department (HOSPITAL_COMMUNITY): Payer: Medicaid Other

## 2013-03-18 ENCOUNTER — Inpatient Hospital Stay (HOSPITAL_COMMUNITY)
Admission: EM | Admit: 2013-03-18 | Discharge: 2013-03-20 | DRG: 178 | Disposition: A | Discharge status: Home / Self Care | Payer: Medicaid Other | Attending: Internal Medicine | Admitting: Internal Medicine

## 2013-03-18 DIAGNOSIS — F319 Bipolar disorder, unspecified: Secondary | ICD-10-CM | POA: Diagnosis present

## 2013-03-18 DIAGNOSIS — F909 Attention-deficit hyperactivity disorder, unspecified type: Secondary | ICD-10-CM | POA: Diagnosis present

## 2013-03-18 DIAGNOSIS — R188 Other ascites: Secondary | ICD-10-CM | POA: Diagnosis present

## 2013-03-18 DIAGNOSIS — R109 Unspecified abdominal pain: Secondary | ICD-10-CM

## 2013-03-18 DIAGNOSIS — A088 Other specified intestinal infections: Secondary | ICD-10-CM | POA: Diagnosis present

## 2013-03-18 DIAGNOSIS — J690 Pneumonitis due to inhalation of food and vomit: Principal | ICD-10-CM | POA: Diagnosis present

## 2013-03-18 DIAGNOSIS — K5289 Other specified noninfective gastroenteritis and colitis: Secondary | ICD-10-CM

## 2013-03-18 DIAGNOSIS — I498 Other specified cardiac arrhythmias: Secondary | ICD-10-CM

## 2013-03-18 DIAGNOSIS — F1210 Cannabis abuse, uncomplicated: Secondary | ICD-10-CM | POA: Diagnosis present

## 2013-03-18 DIAGNOSIS — R112 Nausea with vomiting, unspecified: Secondary | ICD-10-CM | POA: Diagnosis present

## 2013-03-18 DIAGNOSIS — F17200 Nicotine dependence, unspecified, uncomplicated: Secondary | ICD-10-CM | POA: Diagnosis present

## 2013-03-18 DIAGNOSIS — R001 Bradycardia, unspecified: Secondary | ICD-10-CM | POA: Diagnosis present

## 2013-03-18 DIAGNOSIS — Z79899 Other long term (current) drug therapy: Secondary | ICD-10-CM

## 2013-03-18 DIAGNOSIS — R52 Pain, unspecified: Secondary | ICD-10-CM

## 2013-03-18 LAB — CBC WITH DIFFERENTIAL/PLATELET
Basophils Relative: 0 % (ref 0–1)
Hemoglobin: 13.3 g/dL (ref 13.0–17.0)
Lymphs Abs: 1.8 10*3/uL (ref 0.7–4.0)
Monocytes Relative: 11 % (ref 3–12)
Neutro Abs: 2.5 10*3/uL (ref 1.7–7.7)
Neutrophils Relative %: 50 % (ref 43–77)
RBC: 4.56 MIL/uL (ref 4.22–5.81)

## 2013-03-18 LAB — COMPREHENSIVE METABOLIC PANEL
ALT: 25 U/L (ref 0–53)
Albumin: 4.2 g/dL (ref 3.5–5.2)
Alkaline Phosphatase: 68 U/L (ref 39–117)
BUN: 23 mg/dL (ref 6–23)
Chloride: 102 mEq/L (ref 96–112)
Potassium: 4.3 mEq/L (ref 3.5–5.1)
Total Bilirubin: 0.2 mg/dL — ABNORMAL LOW (ref 0.3–1.2)

## 2013-03-18 LAB — URINALYSIS, ROUTINE W REFLEX MICROSCOPIC
Bilirubin Urine: NEGATIVE
Glucose, UA: NEGATIVE mg/dL
Hgb urine dipstick: NEGATIVE
Ketones, ur: NEGATIVE mg/dL
Nitrite: NEGATIVE
pH: 7.5 (ref 5.0–8.0)

## 2013-03-18 LAB — URINE MICROSCOPIC-ADD ON

## 2013-03-18 LAB — MAGNESIUM: Magnesium: 1.7 mg/dL (ref 1.5–2.5)

## 2013-03-18 LAB — CG4 I-STAT (LACTIC ACID)
Lactic Acid, Venous: 2.27 mmol/L — ABNORMAL HIGH (ref 0.5–2.2)
Lactic Acid, Venous: 2.03 mmol/L (ref 0.5–2.2)

## 2013-03-18 LAB — LIPASE, BLOOD: Lipase: 46 U/L (ref 11–59)

## 2013-03-18 LAB — RAPID URINE DRUG SCREEN, HOSP PERFORMED
Amphetamines: NOT DETECTED
Barbiturates: NOT DETECTED
Benzodiazepines: NOT DETECTED
Tetrahydrocannabinol: POSITIVE — AB

## 2013-03-18 MED ORDER — IOHEXOL 300 MG/ML  SOLN
100.0000 mL | Freq: Once | INTRAMUSCULAR | Status: AC | PRN
  Administered 2013-03-18: 80 mL via INTRAVENOUS

## 2013-03-18 MED ORDER — KCL IN DEXTROSE-NACL 20-5-0.45 MEQ/L-%-% IV SOLN
INTRAVENOUS | Status: DC
  Administered 2013-03-18 – 2013-03-20 (×3): via INTRAVENOUS
  Filled 2013-03-18 (×9): qty 1000

## 2013-03-18 MED ORDER — ATOMOXETINE HCL 40 MG PO CAPS
80.0000 mg | ORAL_CAPSULE | Freq: Every day | ORAL | Status: DC
  Administered 2013-03-18 – 2013-03-20 (×3): 80 mg via ORAL
  Filled 2013-03-18 (×3): qty 2

## 2013-03-18 MED ORDER — SODIUM CHLORIDE 0.9 % IJ SOLN
3.0000 mL | Freq: Two times a day (BID) | INTRAMUSCULAR | Status: DC
  Administered 2013-03-18: 22:00:00 3 mL via INTRAVENOUS

## 2013-03-18 MED ORDER — SODIUM CHLORIDE 0.9 % IV BOLUS (SEPSIS)
1000.0000 mL | Freq: Once | INTRAVENOUS | Status: AC
  Administered 2013-03-18: 1000 mL via INTRAVENOUS

## 2013-03-18 MED ORDER — IOHEXOL 300 MG/ML  SOLN
25.0000 mL | INTRAMUSCULAR | Status: DC
  Administered 2013-03-18: 25 mL via ORAL

## 2013-03-18 MED ORDER — HYDROXYZINE PAMOATE 25 MG PO CAPS
25.0000 mg | ORAL_CAPSULE | Freq: Every day | ORAL | Status: DC
  Filled 2013-03-18 (×2): qty 1

## 2013-03-18 MED ORDER — IOHEXOL 300 MG/ML  SOLN
25.0000 mL | INTRAMUSCULAR | Status: DC
Start: 2013-03-18 — End: 2013-03-18

## 2013-03-18 MED ORDER — THIAMINE HCL 100 MG/ML IJ SOLN
100.0000 mg | Freq: Every day | INTRAMUSCULAR | Status: DC
  Administered 2013-03-19 – 2013-03-20 (×2): 100 mg via INTRAVENOUS
  Filled 2013-03-18: qty 2
  Filled 2013-03-18 (×2): qty 1

## 2013-03-18 MED ORDER — ONDANSETRON HCL 4 MG/2ML IJ SOLN
4.0000 mg | Freq: Once | INTRAMUSCULAR | Status: AC
  Administered 2013-03-18: 4 mg via INTRAVENOUS
  Filled 2013-03-18: qty 2

## 2013-03-18 MED ORDER — ENOXAPARIN SODIUM 40 MG/0.4ML ~~LOC~~ SOLN
40.0000 mg | SUBCUTANEOUS | Status: DC
  Administered 2013-03-18 – 2013-03-19 (×2): 40 mg via SUBCUTANEOUS
  Filled 2013-03-18 (×4): qty 0.4

## 2013-03-18 MED ORDER — PIPERACILLIN-TAZOBACTAM 3.375 G IVPB
3.3750 g | Freq: Three times a day (TID) | INTRAVENOUS | Status: DC
  Administered 2013-03-18 – 2013-03-20 (×6): 3.375 g via INTRAVENOUS
  Filled 2013-03-18 (×8): qty 50

## 2013-03-18 MED ORDER — ONDANSETRON HCL 4 MG/2ML IJ SOLN: 4.0000 mg | Freq: Four times a day (QID) | INTRAMUSCULAR | Status: DC | PRN

## 2013-03-18 MED ORDER — MORPHINE SULFATE 2 MG/ML IJ SOLN: 1.0000 mg | INTRAMUSCULAR | Status: DC | PRN

## 2013-03-18 MED ORDER — PROMETHAZINE HCL 25 MG/ML IJ SOLN
25.0000 mg | Freq: Once | INTRAMUSCULAR | Status: AC
  Administered 2013-03-18: 25 mg via INTRAVENOUS
  Filled 2013-03-18: qty 1

## 2013-03-18 MED ORDER — ACETAMINOPHEN 325 MG PO TABS: 650.0000 mg | ORAL_TABLET | Freq: Four times a day (QID) | ORAL | Status: DC | PRN

## 2013-03-18 MED ORDER — ONDANSETRON HCL 4 MG/2ML IJ SOLN
4.0000 mg | Freq: Once | INTRAMUSCULAR | Status: AC
  Administered 2013-03-18: 4 mg via INTRAVENOUS

## 2013-03-18 MED ORDER — PANTOPRAZOLE SODIUM 40 MG IV SOLR
40.0000 mg | Freq: Two times a day (BID) | INTRAVENOUS | Status: DC
  Administered 2013-03-18 – 2013-03-20 (×4): 40 mg via INTRAVENOUS
  Filled 2013-03-18 (×5): qty 40

## 2013-03-18 MED ORDER — ARIPIPRAZOLE 10 MG PO TABS
10.0000 mg | ORAL_TABLET | Freq: Every day | ORAL | Status: DC
  Administered 2013-03-19 – 2013-03-20 (×2): 10 mg via ORAL
  Filled 2013-03-18 (×4): qty 1

## 2013-03-18 MED ORDER — ACETAMINOPHEN 650 MG RE SUPP: 650.0000 mg | Freq: Four times a day (QID) | RECTAL | Status: DC | PRN

## 2013-03-18 MED ORDER — FOLIC ACID 5 MG/ML IJ SOLN
1.0000 mg | Freq: Every day | INTRAMUSCULAR | Status: DC
  Administered 2013-03-18 – 2013-03-20 (×3): 1 mg via INTRAVENOUS
  Filled 2013-03-18 (×3): qty 0.2

## 2013-03-18 MED ORDER — ONDANSETRON HCL 4 MG PO TABS: 4.0000 mg | ORAL_TABLET | Freq: Four times a day (QID) | ORAL | Status: DC | PRN

## 2013-03-18 MED ORDER — ONDANSETRON HCL 4 MG/2ML IJ SOLN
INTRAMUSCULAR | Status: AC
  Filled 2013-03-18: qty 2

## 2013-03-18 MED ORDER — ONDANSETRON HCL 4 MG/2ML IJ SOLN
4.0000 mg | Freq: Once | INTRAMUSCULAR | Status: AC
  Filled 2013-03-18: qty 2

## 2013-03-18 NOTE — ED Notes (Signed)
Patient transported to CT 

## 2013-03-18 NOTE — Consult Note (Signed)
ANTIBIOTIC CONSULT NOTE - INITIAL  Pharmacy Consult for Zosyn Indication: aspiration pneumonia  No Known Allergies  Patient Measurements: Height: 5\' 6"  (167.6 cm) Weight: 130 lb (58.968 kg) IBW/kg (Calculated) : 63.8  Vital Signs: Temp: 98.8 F (37.1 C) (09/22 1452) Temp src: Oral (09/22 1452) BP: 149/85 mmHg (09/22 1600) Pulse Rate: 37 (09/22 1600) Intake/Output from previous day:   Intake/Output from this shift: Total I/O In: 2000 [I.V.:2000] Out: -   Labs:  Recent Labs  03/18/13 0740  WBC 4.9  HGB 13.3  PLT 199  CREATININE 0.96   Estimated Creatinine Clearance: 100.7 ml/min (by C-G formula based on Cr of 0.96).  Microbiology: No results found for this or any previous visit (from the past 720 hour(s)).  Medical History: Past Medical History  Diagnosis Date  . Bipolar 1 disorder   . ADHD (attention deficit hyperactivity disorder)   . Manic, depressive    Assessment: 21yom presents to the ED with abdominal pain, nausea, and vomiting. CT abdomen/pelvix showed right lower lobe airspace disease, suspicious for infection versus aspiration. He will begin empiric zosyn. Renal function wnl.  Goal of Therapy:  Appropriate zosyn dosing  Plan:  1) Zosyn 3.375g IV q8 (4 hour infusion) 2) Follow renal function, cultures, LOT   Fredrik Rigger 03/18/2013,4:16 PM

## 2013-03-18 NOTE — ED Notes (Signed)
Md aware of pt HR.

## 2013-03-18 NOTE — Consult Note (Signed)
Reason for Consult:abdominal pain Referring Physician: Dr. Jonelle Sports is an 22 y.o. male.  HPI: the patient is a 22 year old male who was evaluated in the ED secondary to abdominal pain periumbilically. Patient was worked up with laboratory studies as well as a CT and ultrasound. CT revealed ascites in the abdomen as well as around the gallbladder. Patient also underwent ultrasound revealed questional thickness of gallbladder wall, but unspecific. Patient laboratories that were within normal limits.  According to the patient's mother she had some abdominal pain began at 0500 today. Patient subsequently this began having nausea and emesis throughout the day. The pain has stayed periumbilically on the right upper quadrant right lower quadrant. According to his mother is no other sick contacts around the patient.  Past Medical History  Diagnosis Date  . Bipolar 1 disorder   . ADHD (attention deficit hyperactivity disorder)   . Manic, depressive     Past Surgical History  Procedure Laterality Date  . Hand surgery      left    History reviewed. No pertinent family history.  Social History:  reports that he has been smoking.  He does not have any smokeless tobacco history on file. He reports that  drinks alcohol. He reports that he uses illicit drugs (Marijuana).  Allergies: No Known Allergies  Medications: I have reviewed the patient's current medications.  Results for orders placed during the hospital encounter of 03/18/13 (from the past 48 hour(s))  CBC WITH DIFFERENTIAL     Status: None   Collection Time    03/18/13  7:40 AM      Result Value Range   WBC 4.9  4.0 - 10.5 K/uL   RBC 4.56  4.22 - 5.81 MIL/uL   Hemoglobin 13.3  13.0 - 17.0 g/dL   HCT 16.1  09.6 - 04.5 %   MCV 87.3  78.0 - 100.0 fL   MCH 29.2  26.0 - 34.0 pg   MCHC 33.4  30.0 - 36.0 g/dL   RDW 40.9  81.1 - 91.4 %   Platelets 199  150 - 400 K/uL   Neutrophils Relative % 50  43 - 77 %   Neutro Abs 2.5   1.7 - 7.7 K/uL   Lymphocytes Relative 37  12 - 46 %   Lymphs Abs 1.8  0.7 - 4.0 K/uL   Monocytes Relative 11  3 - 12 %   Monocytes Absolute 0.6  0.1 - 1.0 K/uL   Eosinophils Relative 1  0 - 5 %   Eosinophils Absolute 0.1  0.0 - 0.7 K/uL   Basophils Relative 0  0 - 1 %   Basophils Absolute 0.0  0.0 - 0.1 K/uL  COMPREHENSIVE METABOLIC PANEL     Status: Abnormal   Collection Time    03/18/13  7:40 AM      Result Value Range   Sodium 136  135 - 145 mEq/L   Potassium 4.3  3.5 - 5.1 mEq/L   Chloride 102  96 - 112 mEq/L   CO2 24  19 - 32 mEq/L   Glucose, Bld 94  70 - 99 mg/dL   BUN 23  6 - 23 mg/dL   Creatinine, Ser 7.82  0.50 - 1.35 mg/dL   Calcium 9.3  8.4 - 95.6 mg/dL   Total Protein 7.2  6.0 - 8.3 g/dL   Albumin 4.2  3.5 - 5.2 g/dL   AST 43 (*) 0 - 37 U/L   ALT 25  0 - 53 U/L   Alkaline Phosphatase 68  39 - 117 U/L   Total Bilirubin 0.2 (*) 0.3 - 1.2 mg/dL   GFR calc non Af Amer >90  >90 mL/min   GFR calc Af Amer >90  >90 mL/min   Comment: (NOTE)     The eGFR has been calculated using the CKD EPI equation.     This calculation has not been validated in all clinical situations.     eGFR's persistently <90 mL/min signify possible Chronic Kidney     Disease.  LIPASE, BLOOD     Status: None   Collection Time    03/18/13  7:40 AM      Result Value Range   Lipase 46  11 - 59 U/L  CG4 I-STAT (LACTIC ACID)     Status: Abnormal   Collection Time    03/18/13  8:16 AM      Result Value Range   Lactic Acid, Venous 2.27 (*) 0.5 - 2.2 mmol/L  URINE RAPID DRUG SCREEN (HOSP PERFORMED)     Status: Abnormal   Collection Time    03/18/13  9:23 AM      Result Value Range   Opiates NONE DETECTED  NONE DETECTED   Cocaine NONE DETECTED  NONE DETECTED   Benzodiazepines NONE DETECTED  NONE DETECTED   Amphetamines NONE DETECTED  NONE DETECTED   Tetrahydrocannabinol POSITIVE (*) NONE DETECTED   Barbiturates NONE DETECTED  NONE DETECTED   Comment:            DRUG SCREEN FOR MEDICAL PURPOSES      ONLY.  IF CONFIRMATION IS NEEDED     FOR ANY PURPOSE, NOTIFY LAB     WITHIN 5 DAYS.                LOWEST DETECTABLE LIMITS     FOR URINE DRUG SCREEN     Drug Class       Cutoff (ng/mL)     Amphetamine      1000     Barbiturate      200     Benzodiazepine   200     Tricyclics       300     Opiates          300     Cocaine          300     THC              50  URINALYSIS, ROUTINE W REFLEX MICROSCOPIC     Status: Abnormal   Collection Time    03/18/13  9:24 AM      Result Value Range   Color, Urine YELLOW  YELLOW   APPearance CLEAR  CLEAR   Specific Gravity, Urine 1.026  1.005 - 1.030   pH 7.5  5.0 - 8.0   Glucose, UA NEGATIVE  NEGATIVE mg/dL   Hgb urine dipstick NEGATIVE  NEGATIVE   Bilirubin Urine NEGATIVE  NEGATIVE   Ketones, ur NEGATIVE  NEGATIVE mg/dL   Protein, ur NEGATIVE  NEGATIVE mg/dL   Urobilinogen, UA 0.2  0.0 - 1.0 mg/dL   Nitrite NEGATIVE  NEGATIVE   Leukocytes, UA SMALL (*) NEGATIVE  URINE MICROSCOPIC-ADD ON     Status: None   Collection Time    03/18/13  9:24 AM      Result Value Range   Squamous Epithelial / LPF RARE  RARE   WBC, UA 0-2  <3 WBC/hpf  RBC / HPF 0-2  <3 RBC/hpf   Bacteria, UA RARE  RARE  CG4 I-STAT (LACTIC ACID)     Status: None   Collection Time    03/18/13 10:43 AM      Result Value Range   Lactic Acid, Venous 2.03  0.5 - 2.2 mmol/L  MAGNESIUM     Status: None   Collection Time    03/18/13  4:20 PM      Result Value Range   Magnesium 1.7  1.5 - 2.5 mg/dL    US Abdomen Complete  03/18/2013   CLINICAL DATA:  PAIN AND VOMITING. FOLLOW UP CT.  EXAM: ABDOMEN ULTRASOUND  COMPARISON:  CT OF THE ABDOMEN AND PELVIS 03/18/2013  FINDINGS: Gallbladder  GALLBLADDER SHOWS MARKED WALL THICKENING, MEASURING 6.6 MM. NO STONES OR PERICHOLECYSTIC FLUID IDENTIFIED. NO SONOGRAPHIC MURPHY SIGN.  Common bile duct  Diameter: 2.6 MM  Liver  No focal lesion identified. Within normal limits in parenchymal echogenicity.  IVC  No abnormality visualized.   Pancreas  Visualized portion unremarkable.  Spleen  7.0 CM, NORMAL IN APPEARANCE.  Right Kidney  Length: 12.4 CM IN LENGTH. Renal parenchyma is mildly echogenic. No focal mass or hydronephrosis.  Left Kidney  Length: 10.0 cm in length. Mildly echogenic. No focal mass or hydronephrosis.  Abdominal aorta  2.4 cm maximum diameter.  IMPRESSION: 1. Marked gallbladder wall thickening without stones or other evidence for acute cholecystitis. Findings are nonspecific and can be associated with ascites, hypoproteinemia, cirrhosis, hepatitis. The findings can also be seen with acalculous cholecystitis, which is less likely given the lack of a sonographic Murphy sign. 2. Mildly echogenic renal parenchyma without hydronephrosis or focal renal mass.   Electronically Signed   By: Rosalie Gums M.D.   On: 03/18/2013 15:01   Ct Abdomen Pelvis W Contrast  03/18/2013   CLINICAL DATA:  Right upper quadrant abdominal pain. Epigastric pain. Nausea and vomiting.  EXAM: CT ABDOMEN AND PELVIS WITH CONTRAST  TECHNIQUE: Multidetector CT imaging of the abdomen and pelvis was performed using the standard protocol following bolus administration of intravenous contrast.  CONTRAST:  80mL OMNIPAQUE IOHEXOL 300 MG/ML  SOLN  COMPARISON:  None.  FINDINGS: Lung bases: Motion degradation.  Right base patchy airspace disease.  Normal heart size. Trace right pleural fluid or thickening.  Abdomen/Pelvis: Normal liver, spleen, stomach, pancreas, gallbladder, biliary tract. Periportal edema.  Normal adrenal glands and right kidney. 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  Paucity of retroperitoneal fat. No definite adenopathy.  Underdistended hepatic flexure and ascending colon.  Normal terminal ileum. Appendix likely identified on image 53/ series 2, and normal.  Normal small bowel. Small volume simple appearing abdominal pelvic ascites.  No pelvic adenopathy.  Normal urinary bladder and prostate.  Bones/Musculoskeletal: No acute osseous abnormality.  Loss of intervertebral disc height at the lumbosacral junction.  IMPRESSION: 1. Small volume abdominal pelvic ascites, of indeterminate etiology. 2. Right lower lobe airspace disease, suspicious for infection or aspiration. Trace adjacent right pleural fluid or thickening, likely secondary. 3. Tonic, partially malrotated left kidney. 4. Fluid adjacent the gallbladder. Nonspecific in the setting of ascites. If suspicion of acute cholecystitis, consider ultrasound.   Electronically Signed   By: Jeronimo Greaves   On: 03/18/2013 13:15    Review of Systems  Constitutional: Negative for fever and chills.  HENT: Negative.   Respiratory: Negative.   Cardiovascular: Negative.   Gastrointestinal: Positive for nausea and vomiting. Negative for diarrhea.  Genitourinary: Negative.   Musculoskeletal: Negative.  Skin: Negative.   Neurological: Negative.   All other systems reviewed and are negative.   Blood pressure 149/85, pulse 37, temperature 98.8 F (37.1 C), temperature source Oral, resp. rate 20, height 5\' 6"  (1.676 m), weight 130 lb (58.968 kg), SpO2 95.00%. Physical Exam  Constitutional: He is oriented to person, place, and time. He appears well-developed and well-nourished.  HENT:  Head: Normocephalic.  Eyes: Conjunctivae and EOM are normal. Pupils are equal, round, and reactive to light.  Neck: Normal range of motion. Neck supple.  Cardiovascular: Normal rate and normal heart sounds.   Respiratory: Effort normal and breath sounds normal.  GI: Soft. Bowel sounds are normal. He exhibits no distension. There is tenderness (periumbilically). There is no rebound and no guarding.  Musculoskeletal: Normal range of motion.  Neurological: He is alert and oriented to person, place, and time.  Skin: Skin is warm and dry.    Assessment/Plan: 22 year old male with likely acute gastroenteritis. 1.  I Not believe that the patient has an acute abdomen. Currently his abdominal tenderness likely due to  gastroenteritis. He is no pain in the right upper quadrant was not likely related to his gallbladder or any thickening that is seen on ultrasound. I believe it again is likely due to his ascites. 2. Discussed this with the patient, his mother, and Dr. Carmela Rima.  Should his condition change would be happy to evaluate the patient and  Lajean Saver 03/18/2013, 6:05 PM

## 2013-03-18 NOTE — H&P (Signed)
[facility] History and Physical  Connor Weber ZOX:[id number] DOB: 1991/03/01 DOA: 03/18/2013  Referring physician: Dr Radford Pax.  PCP: No PCP Per Patient  Specialists: surgery  Chief Complaint: Abdominal pain, vomiting.   HPI: Connor Weber is a 22 y.o. male with PMH significant for Bipolar who presents to ed complaining of abdominal pain and vomiting. Symptoms started day prior to admission. Pain is in the mid abdomen, 6/10, sharp in quality. He has vomit like 10 times. Denies vomiting blood. Last BM early today. Denies melena, hematochezia.  He denies dysuria, increase frequency.  He denies chest pain, dyspnea.   Review of Systems: Negative except as per HPI.   Past Medical History  Diagnosis Date  . Bipolar 1 disorder   . ADHD (attention deficit hyperactivity disorder)   . Manic, depressive    Past Surgical History  Procedure Laterality Date  . Hand surgery      left   Social History:  reports that he has been smoking.  He does not have any smokeless tobacco history on file. He reports that  drinks alcohol. He reports that he uses illicit drugs (Marijuana).   No Known Allergies  Family history: patient doesn't know.   Prior to Admission medications   Medication Sig Start Date End Date Taking? Authorizing Provider  ARIPiprazole (ABILIFY) 10 MG tablet Take 10 mg by mouth daily.   Yes Historical Provider, MD  atomoxetine (STRATTERA) 80 MG capsule Take 80 mg by mouth daily.   Yes Historical Provider, MD  hydrOXYzine (VISTARIL) 25 MG capsule Take 25 mg by mouth daily.   Yes Historical Provider, MD   Physical Exam: Filed Vitals:   03/18/13 1600  BP: 149/85  Pulse: 37  Temp:   Resp:    General Appearance:    Alert, cooperative, no distress, appears stated age  Head:    Normocephalic, without obvious abnormality, atraumatic  Eyes:    PERRL, conjunctiva/corneas clear, EOM's intact,       Ears:    Normal TM's and external ear canals, both ears  Nose:   Nares normal,  septum midline, mucosa normal, no drainage    or sinus tenderness  Throat:   Lips, mucosa, and tongue normal; teeth and gums normal  Neck:   Supple, symmetrical, trachea midline, no adenopathy;       thyroid:  No enlargement/tenderness/nodules; no carotid   bruit or JVD  Back:     Symmetric, no curvature, ROM normal, no CVA tenderness  Lungs:     Clear to auscultation bilaterally, respirations unlabored  Chest wall:    No tenderness or deformity  Heart:    Regular rate and rhythm, S1 and S2 normal, no murmur, rub   or gallop  Abdomen:     Soft, lower quadrant tender to palpation, bowel sounds active all four quadrants,    no masses, no organomegaly        Extremities:   Extremities normal, atraumatic, no cyanosis or edema  Pulses:   2+ and symmetric all extremities  Skin:   Skin color, texture, turgor normal, no rashes or lesions  Lymph nodes:   Cervical, supraclavicular, and axillary nodes normal  Neurologic:   CNII-XII intact. Normal strength, sensation and reflexes      throughout        Labs on Admission:  Basic Metabolic Panel:  Recent Labs Lab 03/18/13 0740  NA 136  K 4.3  CL 102  CO2 24  GLUCOSE 94  BUN 23  CREATININE 0.96  CALCIUM 9.3   Liver Function Tests:  Recent Labs Lab 03/18/13 0740  AST 43*  ALT 25  ALKPHOS 68  BILITOT 0.2*  PROT 7.2  ALBUMIN 4.2    Recent Labs Lab 03/18/13 0740  LIPASE 46   No results found for this basename: AMMONIA,  in the last 168 hours CBC:  Recent Labs Lab 03/18/13 0740  WBC 4.9  NEUTROABS 2.5  HGB 13.3  HCT 39.8  MCV 87.3  PLT 199   Cardiac Enzymes: No results found for this basename: CKTOTAL, CKMB, CKMBINDEX, TROPONINI,  in the last 168 hours  BNP (last 3 results) No results found for this basename: PROBNP,  in the last 8760 hours CBG: No results found for this basename: GLUCAP,  in the last 168 hours  Radiological Exams on Admission: US Abdomen Complete  03/18/2013   CLINICAL DATA:  PAIN AND  VOMITING. FOLLOW UP CT.  EXAM: ABDOMEN ULTRASOUND  COMPARISON:  CT OF THE ABDOMEN AND PELVIS 03/18/2013  FINDINGS: Gallbladder  GALLBLADDER SHOWS MARKED WALL THICKENING, MEASURING 6.6 MM. NO STONES OR PERICHOLECYSTIC FLUID IDENTIFIED. NO SONOGRAPHIC MURPHY SIGN.  Common bile duct  Diameter: 2.6 MM  Liver  No focal lesion identified. Within normal limits in parenchymal echogenicity.  IVC  No abnormality visualized.  Pancreas  Visualized portion unremarkable.  Spleen  7.0 CM, NORMAL IN APPEARANCE.  Right Kidney  Length: 12.4 CM IN LENGTH. Renal parenchyma is mildly echogenic. No focal mass or hydronephrosis.  Left Kidney  Length: 10.0 cm in length. Mildly echogenic. No focal mass or hydronephrosis.  Abdominal aorta  2.4 cm maximum diameter.  IMPRESSION: 1. Marked gallbladder wall thickening without stones or other evidence for acute cholecystitis. Findings are nonspecific and can be associated with ascites, hypoproteinemia, cirrhosis, hepatitis. The findings can also be seen with acalculous cholecystitis, which is less likely given the lack of a sonographic Murphy sign. 2. Mildly echogenic renal parenchyma without hydronephrosis or focal renal mass.   Electronically Signed   By: Rosalie Gums M.D.   On: 03/18/2013 15:01   Ct Abdomen Pelvis W Contrast  03/18/2013   CLINICAL DATA:  Right upper quadrant abdominal pain. Epigastric pain. Nausea and vomiting.  EXAM: CT ABDOMEN AND PELVIS WITH CONTRAST  TECHNIQUE: Multidetector CT imaging of the abdomen and pelvis was performed using the standard protocol following bolus administration of intravenous contrast.  CONTRAST:  80mL OMNIPAQUE IOHEXOL 300 MG/ML  SOLN  COMPARISON:  None.  FINDINGS: Lung bases: Motion degradation.  Right base patchy airspace disease.  Normal heart size. Trace right pleural fluid or thickening.  Abdomen/Pelvis: Normal liver, spleen, stomach, pancreas, gallbladder, biliary tract. Periportal edema.  Normal adrenal glands and right kidney. The left  kidney is ptotic and malrotated.  Paucity of retroperitoneal fat. No definite adenopathy.  Underdistended hepatic flexure and ascending colon.  Normal terminal ileum. Appendix likely identified on image 53/ series 2, and normal.  Normal small bowel. Small volume simple appearing abdominal pelvic ascites.  No pelvic adenopathy.  Normal urinary bladder and prostate.  Bones/Musculoskeletal: No acute osseous abnormality. Loss of intervertebral disc height at the lumbosacral junction.  IMPRESSION: 1. Small volume abdominal pelvic ascites, of indeterminate etiology. 2. Right lower lobe airspace disease, suspicious for infection or aspiration. Trace adjacent right pleural fluid or thickening, likely secondary. 3. Tonic, partially malrotated left kidney. 4. Fluid adjacent the gallbladder. Nonspecific in the setting of ascites. If suspicion of acute cholecystitis, consider ultrasound.   Electronically Signed   By: Jeronimo Greaves  On: 03/18/2013 13:15    EKG: ordered.   Assessment/Plan Active Problems:   Abdominal pain, acute   Nausea & vomiting   Bradycardia  1-Nausea, vomiting abdominal pain: CT abdomen with small amount of pelvic  fluids. [place] with Gallbladder thickening without stone, Findings are nonspecific and can be associated with ascites, hypoproteinemia, cirrhosis, hepatitis. The findings can also be seen with acalculous cholecystitis. No transaminases. Lipase normal.  IV fluids, IV zofran.  Surgery consult.  IV protonix.  Urine culture.   2-? Right lower lobe airspace disease, suspicious for infection or aspiration by CT scan: Will start IV zosyn, this will also cover for intraabdominal infection.   3-Bradycardia: asymptomatic. Check EKG, Mg level. Reviewed medications.  4-Bipolar: continue with home medications.      Code Status: full code.  Family Communication: Care discussed with patient.  Disposition Plan: expect 2 to 3 days.   Time spent: 65 minutes.   REGALADO,BELKYS [facility] Pager [phone]  If 7PM-7AM, please contact night-coverage [email or web address] Password [email or web address] 03/18/2013, 4:10 PM

## 2013-03-18 NOTE — ED Notes (Signed)
Lactic acid results given to primary tech

## 2013-03-18 NOTE — ED Provider Notes (Signed)
CSN: [id number]     Arrival date & time 03/18/13  3086 History   First MD Initiated Contact with Patient 03/18/13 [phone]     Chief Complaint  Patient presents with  . Abdominal Pain  . Emesis   (Consider location/radiation/quality/duration/timing/severity/associated sxs/prior Treatment) The history is provided by the patient and a parent.  MOXON MESSLER is a 22 y/o M with PMHx of Bipolar disorder, ADHD, manic depression presenting to the ED, with mother, with abdominal pain, nausea, and emesis. As per mother's report, patient came into mother's bedroom at approximately 5:30-5:45 AM this morning, crying that his stomach was bothering him - mother reported that this is just not him, reported that he never gets sick. Mother reported that on the way to the ED patient had sudden emesis episode in the car, mother reported that it got all over her car. Patient reported that he has been having abdominal pain that started this morning, reported that the abdominal pain was localized to the center of her abdomen described as a sharp pain that is constant without radiation. Patient reported that he urinated and had normal BM. As per mother, patient and family went to mother's sister's house for a bbq, mother reported that patient went out to hang out with friends. Denied alcohol use, heroin and cocaine use - patient reported that he smoke 6-7 blunts of marijuana yesterday - as per mother this is normal for the patient. Patient reported that he smokes at least 5-6 blunts of marijuana per day. Denied chills, fever, melena, hematochezia, urinary symptoms, hematuria, neck pain, chest pain, shortness of breath, difficulty breathing, dizziness. PCP none  Past Medical History  Diagnosis Date  . Bipolar 1 disorder   . ADHD (attention deficit hyperactivity disorder)   . Manic, depressive    Past Surgical History  Procedure Laterality Date  . Hand surgery      left   History reviewed. No pertinent family  history. History  Substance Use Topics  . Smoking status: Current Every Day Smoker  . Smokeless tobacco: Not on file  . Alcohol Use: Yes    Review of Systems  Constitutional: Negative for fever.  HENT: Negative for trouble swallowing and neck pain.   Respiratory: Negative for chest tightness and shortness of breath.   Cardiovascular: Negative for chest pain.  Gastrointestinal: Positive for nausea, vomiting and abdominal pain. Negative for diarrhea, constipation, blood in stool and anal bleeding.  Genitourinary: Negative for hematuria, decreased urine volume and difficulty urinating.  Musculoskeletal: Negative for back pain.  Neurological: Negative for dizziness, weakness and headaches.  All other systems reviewed and are negative.    Allergies  Review of patient's allergies indicates no known allergies.  Home Medications   Current Outpatient Rx  Name  Route  Sig  Dispense  Refill  . ARIPiprazole (ABILIFY) 10 MG tablet   Oral   Take 10 mg by mouth daily.         Marland Kitchen atomoxetine (STRATTERA) 80 MG capsule   Oral   Take 80 mg by mouth daily.         . hydrOXYzine (VISTARIL) 25 MG capsule   Oral   Take 25 mg by mouth daily.          BP 149/85  Pulse 37  Temp(Src) 98.8 F (37.1 C) (Oral)  Resp 20  Ht 5\' 6"  (1.676 m)  Wt 130 lb (58.968 kg)  BMI 20.99 kg/m2  SpO2 95% Physical Exam  Nursing note and vitals reviewed. Constitutional:  He appears well-developed and well-nourished. No distress.  Patient found laying in bed, hold emesis bag with emesis mainly of food and stomach contents in bag. Patient has eyes closed. Had eyes closed throughout entire conversation.   HENT:  Head: Normocephalic and atraumatic.  Mouth/Throat: Oropharynx is clear and moist. No oropharyngeal exudate.  Eyes: Conjunctivae and EOM are normal. Pupils are equal, round, and reactive to light. Right eye exhibits no discharge. Left eye exhibits no discharge.  Neck: Normal range of motion. Neck  supple.  Cardiovascular: Normal rate, regular rhythm and normal heart sounds.  Exam reveals no friction rub.   No murmur heard. Pulses:      Radial pulses are 2+ on the right side, and 2+ on the left side.       Dorsalis pedis pulses are 2+ on the right side, and 2+ on the left side.  Pulmonary/Chest: Effort normal and breath sounds normal. No respiratory distress. He has no wheezes. He has no rales.  Abdominal: Soft. Normal appearance and bowel sounds are normal. He exhibits no distension. There is tenderness. There is guarding. There is no rigidity, no rebound, no tenderness at McBurney's point and negative Murphy's sign.    Tenderness localized to the center of the abdomen, when suprapubic region palpated patient had an episode of emesis.  Negative McBurney's point Negative Murphy's sign  Lymphadenopathy:    He has no cervical adenopathy.  Neurological: He is alert. He exhibits normal muscle tone. Coordination normal.  Skin: Skin is warm and dry. No rash noted. He is not diaphoretic. No erythema.  Psychiatric: He has a normal mood and affect. His behavior is normal. Thought content normal.    ED Course  Procedures (including critical care time)  9:14 AM Patient re-assessed. Reported that he feels "terrible." Pain upon palpation to the the epigastric region.   9:22 AM Discussed labs and case with Dr. Lourena Simmonds. Recommended to hydrate, repeat lactic acid, re-assess and if pain continues then scan the patient. Dr. Lourena Simmonds recommended that imaging be held off until patient re-assessed and second lactic acid.    10:40 AM Re-assessed patient, patient reported that he is feeling "alright." Patient still hunched over in bed, in fetal position. Discomfort noted upon palpation to the abdomen. CT scan abdomen and pelvis with contrast ordered.   3:37 PM Spoke with Dr. Sunnie Nielsen, Hospitalist, discussed case. Patient to be admitted to the hospital as inpatient Telemetry. Reviewed labs and imaging  while on the phone with Dr. Sunnie Nielsen - concern regarding rule out for acute cholecystitis versus acalculous cholecystitis.   3:51 PM Dr. Sunnie Nielsen in room, assessing patient.   3:57 PM Patient had an episode of emesis. Dr. Sunnie Nielsen recommended surgery to see patient.   5:09 PM Call for General Surgery consult has been out for at least an hour and a half - spoke with secretary numerous times who has made numerous calls out to General Surgery, still no response.   5:49 PM general surgery in room assessing the patient.  6:00 PM spoke with Dr. Derrell Lolling, as per surgery reported that there really is not much that they can do. Denies any type of need for treatment towards the gallbladder. Negative elevations in white blood cell count, negative elevation in LFTs and alkaline phosphatase-reported that since there is ascites in the abdomen there can be accumulation near the gallbladder that does not necessarily mean infection of the gallbladder process. Dr. Derrell Lolling discussed this with Dr. Sunnie Nielsen.  6:05 PM This provider discussed labs  and imaging results with mother and patient , in great detail. Discussed plan for admission, mother and patient agreed to plan of care.  Labs Review Labs Reviewed  COMPREHENSIVE METABOLIC PANEL - Abnormal; Notable for the following:    AST 43 (*)    Total Bilirubin 0.2 (*)    All other components within normal limits  URINALYSIS, ROUTINE W REFLEX MICROSCOPIC - Abnormal; Notable for the following:    Leukocytes, UA SMALL (*)    All other components within normal limits  URINE RAPID DRUG SCREEN (HOSP PERFORMED) - Abnormal; Notable for the following:    Tetrahydrocannabinol POSITIVE (*)    All other components within normal limits  CG4 I-STAT (LACTIC ACID) - Abnormal; Notable for the following:    Lactic Acid, Venous 2.27 (*)    All other components within normal limits  URINE CULTURE  CBC WITH DIFFERENTIAL  LIPASE, BLOOD  URINE MICROSCOPIC-ADD ON  MAGNESIUM  CG4  I-STAT (LACTIC ACID)   Imaging Review US Abdomen Complete  03/18/2013   CLINICAL DATA:  PAIN AND VOMITING. FOLLOW UP CT.  EXAM: ABDOMEN ULTRASOUND  COMPARISON:  CT OF THE ABDOMEN AND PELVIS 03/18/2013  FINDINGS: Gallbladder  GALLBLADDER SHOWS MARKED WALL THICKENING, MEASURING 6.6 MM. NO STONES OR PERICHOLECYSTIC FLUID IDENTIFIED. NO SONOGRAPHIC MURPHY SIGN.  Common bile duct  Diameter: 2.6 MM  Liver  No focal lesion identified. Within normal limits in parenchymal echogenicity.  IVC  No abnormality visualized.  Pancreas  Visualized portion unremarkable.  Spleen  7.0 CM, NORMAL IN APPEARANCE.  Right Kidney  Length: 12.4 CM IN LENGTH. Renal parenchyma is mildly echogenic. No focal mass or hydronephrosis.  Left Kidney  Length: 10.0 cm in length. Mildly echogenic. No focal mass or hydronephrosis.  Abdominal aorta  2.4 cm maximum diameter.  IMPRESSION: 1. Marked gallbladder wall thickening without stones or other evidence for acute cholecystitis. Findings are nonspecific and can be associated with ascites, hypoproteinemia, cirrhosis, hepatitis. The findings can also be seen with acalculous cholecystitis, which is less likely given the lack of a sonographic Murphy sign. 2. Mildly echogenic renal parenchyma without hydronephrosis or focal renal mass.   Electronically Signed   By: Rosalie Gums M.D.   On: 03/18/2013 15:01   Ct Abdomen Pelvis W Contrast  03/18/2013   CLINICAL DATA:  Right upper quadrant abdominal pain. Epigastric pain. Nausea and vomiting.  EXAM: CT ABDOMEN AND PELVIS WITH CONTRAST  TECHNIQUE: Multidetector CT imaging of the abdomen and pelvis was performed using the standard protocol following bolus administration of intravenous contrast.  CONTRAST:  80mL OMNIPAQUE IOHEXOL 300 MG/ML  SOLN  COMPARISON:  None.  FINDINGS: Lung bases: Motion degradation.  Right base patchy airspace disease.  Normal heart size. Trace right pleural fluid or thickening.  Abdomen/Pelvis: Normal liver, spleen, stomach, pancreas,  gallbladder, biliary tract. Periportal edema.  Normal adrenal glands and right kidney. 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  Paucity of retroperitoneal fat. No definite adenopathy.  Underdistended hepatic flexure and ascending colon.  Normal terminal ileum. Appendix likely identified on image 53/ series 2, and normal.  Normal small bowel. Small volume simple appearing abdominal pelvic ascites.  No pelvic adenopathy.  Normal urinary bladder and prostate.  Bones/Musculoskeletal: No acute osseous abnormality. Loss of intervertebral disc height at the lumbosacral junction.  IMPRESSION: 1. Small volume abdominal pelvic ascites, of indeterminate etiology. 2. Right lower lobe airspace disease, suspicious for infection or aspiration. Trace adjacent right pleural fluid or thickening, likely secondary. 3. Tonic, partially malrotated left kidney.  4. Fluid adjacent the gallbladder. Nonspecific in the setting of ascites. If suspicion of acute cholecystitis, consider ultrasound.   Electronically Signed   By: Jeronimo Greaves   On: 03/18/2013 13:15    MDM   1. Abdominal pain, acute   2. Bradycardia   3. Nausea & vomiting     Patient presenting to the ED with sudden onset of abdominal pain, nausea, vomiting that started this morning at approximately 5:30-5:45AM. Patient was at a bbq yesterday and smoke at least 6-7 blunts, as per patient he smokes at least 5-6 blunts of marijuana per day.  Patient laying in bed with eyes closed, tired - continues to grab his stomach, emesis bag next to him with decent amount of food and stomach contents. Alert, responds to questions and follows commands well. Lungs clear to auscultation. Heart rate and rhythm normal. BS normoactive in all 4 quadrant, tenderness noted to the center of the abdomen, when suprapubic pressure applied patient had episode of emesis.  Negative acute abdomen, negative peritoneal signs.  CBC negative findings. CMP negative findings. Lipase negative elevation.  First lactic acid mild elevation noted of 2.27 - after 2 L of IV fluids repeated lactic acid which decreased to 2.03. Urine negative for infection. Urine drug screen positive for cannabis. CT scan of abdomen and pelvis identified right lower lobe airspace disease, suspicion for possible infection versus aspiration - fluid adjacent to the gallbladder recommended [place] to be performed to rule out possible acute cholecystitis. US performed of the RUQ identified marked gallbladder wall thickening without stones or evidence of acute cholecystitis - findings can also be consistent with acalculous cholecystitis which is less likely since there is lack of sonographic Murphy's sign.  General surgery consulted, general surgery saw and assessed patient. Labs and imaging results reviewed-as per surgeon, did not recommend any form of treatment at this moment, not concerned. Patient to be admitted for possible aspiration versus infectious pneumonia, as well as persistent emesis. Nausea controlled in ED setting, vomiting controlled in ED setting. Patient given fluids and anti-emetic medications via IV. Patient's heart rate has been monitored, patient continues to be bradycardic throughout stay in emergency department-negative signs of drop in blood pressure or respiratory rate. Patient afebrile. Patient admitted to inpatient telemetry. Discussed labs and imaging with mother and patient, discussed plan for admission, patient and mother agreed to plan.    Raymon Mutton, PA-C 03/20/13 1200

## 2013-03-18 NOTE — ED Notes (Signed)
Pt. Vomited a large amount of clear liquid.  Sheets changed , floor cleaned,

## 2013-03-18 NOTE — ED Notes (Signed)
Pt came home at 0130 and complained of abd pain, per mother on way here when on [address] he began vomiting and per tech there was a lot of vomit on mother cars. Pt initially did not want to answer questions. Did begin to answer when mother arrived.

## 2013-03-18 NOTE — ED Notes (Signed)
Results of lactic acid shown to Dr. Beaton 

## 2013-03-19 LAB — URINE CULTURE
Colony Count: NO GROWTH
Culture: NO GROWTH

## 2013-03-19 MED ORDER — HYDROXYZINE HCL 25 MG PO TABS
25.0000 mg | ORAL_TABLET | Freq: Every day | ORAL | Status: DC
  Administered 2013-03-19 – 2013-03-20 (×2): 25 mg via ORAL
  Filled 2013-03-19: qty 1

## 2013-03-19 NOTE — Progress Notes (Signed)
Pt orientation to unit, room and routine. Information packet given to patient and family.  Admission INP armband ID verified with patient, and in place. SR up x 2, fall risk assessment complete with Patient and family verbalizing understanding of risks associated with falls. Pt verbalizes an understanding of how to use the call bell and to call for help before getting out of bed.   Will cont to monitor and assist as needed.  Gilman Schmidt, RN

## 2013-03-19 NOTE — Progress Notes (Signed)
[facility] PROGRESS NOTE  Connor Weber ZOX:[id number] DOB: 06-18-1991 DOA: 03/18/2013 PCP: No PCP Per Patient   HPI/Subjective: Abdominal pain is improving, patient is feeling better and asking about food.  22 yo male with PMH of Bipolar disorder, ADHD, manic depression presenting to the ED, with mother, for abdominal pain, nausea, and emesis. As per mother's report, patient came into mother's bedroom at approximately 5:30-5:45 AM on 9/22, crying that his stomach was bothering him. Mother reported that on the way to the ED patient had sudden emesis episode in the car, mother reported that it got all over her car. Patient reported that he has been having abdominal pain that started this morning 9/22, reported that the abdominal pain was localized to the center of her abdomen described as a sharp pain that is constant without radiation. Patient reported that he urinated and had normal BM. As per mother, patient and family went to mother's sister's house for a bbq, mother reported that patient went out with friends. Denied alcohol use, heroin and cocaine use - patient reported that he smoke 6-7 blunts of marijuana on 9/22 - as per mother this is normal for the patient. Patient reported that he smokes at least 5-6 blunts of marijuana per day. Denied chills, fever, melena, hematochezia, urinary symptoms, hematuria, neck pain, chest pain, shortness of breath, difficulty breathing, dizziness.   Assessment/Plan:  Abdominal Pain: -As of this morning, patient no longer complains of abdominal pain, minimal discomfort with deep epigastric palpation. -CT in ED showed no acute abdomen or cause for pain -US revealed mild gallbladder wall thickening, negative Murphy's sign, no cholecystitis noted. -IV Zosyn started empirically for GI concerns as well as questionable pneumonia. -Surgery consult appreciated  Bradycardia: -Resting HR is running in the low 40s while laying in bed.  -Telemetry to monitor  for possible arrhythmia -EKG showed sinus bradycardia with no acute changes  Nausea/Vomiting: -Well controlled since Zofran and Phenergan were given in the ED yesterday -No episodes of vomiting last night -Pt. Still denies any associated symptoms of headache, fever or diarrhea  ? RLL Pneumonia: -CT revealed RLL airspace disease, possibly infectious or aspiration. -IV Zosyn started to cover for both possible pneumonia as well as GI concerns. -No fever or leukocytosis, and be discharged home on oral antibiotics.  Code Status: Full Family Communication: None at bedside. Disposition Plan: Remain inpatient. Discharge tomorrow if patient can advance diet.   Consultants:  General Surgery-- see note (ED Consulted)  Procedures:  None  Antibiotics:  IV Zosyn started 9/22    Objective: Filed Vitals:   03/19/13 0600  BP: 135/82  Pulse: 42  Temp: 98.6 F (37 C)  Resp: 16    Intake/Output Summary (Last 24 hours) at 03/19/13 1133 Last data filed at 03/19/13 0600  Gross per 24 hour  Intake 947.92 ml  Output      0 ml  Net 947.92 ml   Filed Weights   03/18/13 0658 03/18/13 1922  Weight: 58.968 kg (130 lb) 62.2 kg (137 lb 2 oz)    Exam:   General:  Well appearing male resting comfortably in bed. NAD, AAOx3  Cardiovascular: Bradycardic, Normal S1 S2, No M/G/R  Respiratory: Non labored, no accessory muscle use, diminished lung sounds heard in the RLL with minor rales and dullness to percussion.   Abdomen: Soft, non tender, no guarding, no masses, ascites, or organomegaly palpable.  Bowel sounds normoactive throughout.  Musculoskeletal: Full ROM in extremities. No weakness noted with standing and walking.  Data Reviewed: Basic Metabolic Panel:  Recent Labs Lab 03/18/13 0740 03/18/13 1620  NA 136  --   K 4.3  --   CL 102  --   CO2 24  --   GLUCOSE 94  --   BUN 23  --   CREATININE 0.96  --   CALCIUM 9.3  --   MG  --  1.7   Liver Function Tests:  Recent  Labs Lab 03/18/13 0740  AST 43*  ALT 25  ALKPHOS 68  BILITOT 0.2*  PROT 7.2  ALBUMIN 4.2    Recent Labs Lab 03/18/13 0740  LIPASE 46   CBC:  Recent Labs Lab 03/18/13 0740  WBC 4.9  NEUTROABS 2.5  HGB 13.3  HCT 39.8  MCV 87.3  PLT 199     Studies: US Abdomen Complete  03/18/2013   CLINICAL DATA:  PAIN AND VOMITING. FOLLOW UP CT.  EXAM: ABDOMEN ULTRASOUND  COMPARISON:  CT OF THE ABDOMEN AND PELVIS 03/18/2013  FINDINGS: Gallbladder  GALLBLADDER SHOWS MARKED WALL THICKENING, MEASURING 6.6 MM. NO STONES OR PERICHOLECYSTIC FLUID IDENTIFIED. NO SONOGRAPHIC MURPHY SIGN.  Common bile duct  Diameter: 2.6 MM  Liver  No focal lesion identified. Within normal limits in parenchymal echogenicity.  IVC  No abnormality visualized.  Pancreas  Visualized portion unremarkable.  Spleen  7.0 CM, NORMAL IN APPEARANCE.  Right Kidney  Length: 12.4 CM IN LENGTH. Renal parenchyma is mildly echogenic. No focal mass or hydronephrosis.  Left Kidney  Length: 10.0 cm in length. Mildly echogenic. No focal mass or hydronephrosis.  Abdominal aorta  2.4 cm maximum diameter.  IMPRESSION: 1. Marked gallbladder wall thickening without stones or other evidence for acute cholecystitis. Findings are nonspecific and can be associated with ascites, hypoproteinemia, cirrhosis, hepatitis. The findings can also be seen with acalculous cholecystitis, which is less likely given the lack of a sonographic Murphy sign. 2. Mildly echogenic renal parenchyma without hydronephrosis or focal renal mass.   Electronically Signed   By: Rosalie Gums M.D.   On: 03/18/2013 15:01   Ct Abdomen Pelvis W Contrast  03/18/2013   CLINICAL DATA:  Right upper quadrant abdominal pain. Epigastric pain. Nausea and vomiting.  EXAM: CT ABDOMEN AND PELVIS WITH CONTRAST  TECHNIQUE: Multidetector CT imaging of the abdomen and pelvis was performed using the standard protocol following bolus administration of intravenous contrast.  CONTRAST:  80mL OMNIPAQUE  IOHEXOL 300 MG/ML  SOLN  COMPARISON:  None.  FINDINGS: Lung bases: Motion degradation.  Right base patchy airspace disease.  Normal heart size. Trace right pleural fluid or thickening.  Abdomen/Pelvis: Normal liver, spleen, stomach, pancreas, gallbladder, biliary tract. Periportal edema.  Normal adrenal glands and right kidney. 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  Paucity of retroperitoneal fat. No definite adenopathy.  Underdistended hepatic flexure and ascending colon.  Normal terminal ileum. Appendix likely identified on image 53/ series 2, and normal.  Normal small bowel. Small volume simple appearing abdominal pelvic ascites.  No pelvic adenopathy.  Normal urinary bladder and prostate.  Bones/Musculoskeletal: No acute osseous abnormality. Loss of intervertebral disc height at the lumbosacral junction.  IMPRESSION: 1. Small volume abdominal pelvic ascites, of indeterminate etiology. 2. Right lower lobe airspace disease, suspicious for infection or aspiration. Trace adjacent right pleural fluid or thickening, likely secondary. 3. Tonic, partially malrotated left kidney. 4. Fluid adjacent the gallbladder. Nonspecific in the setting of ascites. If suspicion of acute cholecystitis, consider ultrasound.   Electronically Signed   By: Jeronimo Greaves  On: 03/18/2013 13:15    Scheduled Meds: . ARIPiprazole  10 mg Oral Daily  . atomoxetine  80 mg Oral Daily  . enoxaparin (LOVENOX) injection  40 mg Subcutaneous Q24H  . folic acid  1 mg Intravenous Daily  . hydrOXYzine  25 mg Oral Daily  . pantoprazole (PROTONIX) IV  40 mg Intravenous Q12H  . piperacillin-tazobactam (ZOSYN)  IV  3.375 g Intravenous Q8H  . sodium chloride  3 mL Intravenous Q12H  . thiamine  100 mg Intravenous Daily   Continuous Infusions: . dextrose 5 % and 0.45 % NaCl with KCl 20 mEq/L 125 mL/hr at 03/18/13 2225    Active Problems:   Abdominal pain, acute   Nausea & vomiting   Bradycardia      Connor Weber,  Physician Assistant Student [facility] 03/19/2013, 11:33 AM  LOS: 1 day     Addendum  Patient seen and examined, chart and data base reviewed.  I agree with the above assessment and plan.  For full details please see Mrs. Rob Bunting A PA-S note.   Connor Lipps, MD [facility] Pager: [phone] 03/19/2013, 3:59 PM

## 2013-03-19 NOTE — Progress Notes (Signed)
Brief Nutrition Note:  RD pulled to pt for positive malnutrition screening tool.  Pt denies any poor appetite. Pt states usual weight is 135-140 lbs, currently 137 lbs.  Wt Readings from Last 5 Encounters:  03/18/13 137 lb 2 oz (62.2 kg)   Body mass index is 22.14 kg/(m^2). WNL Currently NPO and requesting food. Discussed with RN who was going to page MD and find out about changing diet.   No nutrition interventions warranted at this time. Please consult as needed.   Isabell Jarvis RD, LDN Pager [phone] After Hours pager [phone]

## 2013-03-19 NOTE — Progress Notes (Signed)
New onset of afib present in pt according to cardiac telemetry. Pt has no complaints of pain and is without distress. Craige Cotta NP notified. EKG ordered and completed. Repeat EKG to be done at 5am. No new orders at this time. Will continue to monitor. Gilman Schmidt

## 2013-03-20 MED ORDER — PANTOPRAZOLE SODIUM 40 MG PO TBEC: 40.0000 mg | DELAYED_RELEASE_TABLET | Freq: Two times a day (BID) | ORAL | Status: DC

## 2013-03-20 NOTE — Discharge Summary (Signed)
Physician Discharge Summary  Connor Weber HYQ:[id number] DOB: [birth year] DOA: 03/18/2013  PCP: No PCP Per Patient  Admit date: 03/18/2013 Discharge date: 03/20/2013  Time spent: 30 minutes  Recommendations for Outpatient Follow-up:  - Establish PCP - Follow up CXR in 2 - 3 weeks with PCP  Discharge Diagnoses:  Active Problems:   Abdominal pain, acute   Nausea & vomiting   Bradycardia   Discharge Condition: Stable    Filed Weights   03/18/13 0658 03/18/13 1922  Weight: 58.968 kg (130 lb) 62.2 kg (137 lb 2 oz)    History of present illness at the time of admission:  22 yo male with PMH of Bipolar disorder, ADHD, manic depression presented to the ED, with mother, for abdominal pain, nausea, and emesis. As per mother's report, patient came into mother's bedroom at approximately 5:30-5:45 AM on 9/22, crying that his stomach was bothering him. Mother reported that on the way to the ED patient had sudden emesis episode in the car. Patient reported that he has been having abdominal pain that started early morning on 9/22, reported that the abdominal pain was localized to the center of his abdomen, described as a sharp pain that is constant without radiation. Patient reported that he urinated and had normal BM. As per mother, patient and family went to mother's sister's house for a bbq, mother reported that patient went out with friends. Denied alcohol use, heroin and cocaine use - patient reported that he smoked 6-7 blunts of marijuana on 9/22 - as per mother this is normal for the patient. Patient reported that he smokes at least 5-6 blunts of marijuana per day.    Hospital Course:  Abdominal Pain:  -Resolved as of 9/23 with no pain medications -CT in ED showed no acute abdomen or cause for pain  -US revealed mild gallbladder wall thickening, negative Murphy's sign, no cholecystitis noted.  -IV Zosyn started empirically for GI concerns as well as aspiration pneumonia.  -Surgery consult  appreciated. -Probable viral gastroenteritis  Bradycardia:  -Resting HR in the low 40s while laying in bed.  -Monitored on Telemetry for possible arrhythmia with no changes or abnormalities seen -EKG showed sinus bradycardia with no acute changes  -HR increases to mid 50s when active -Asymptomatic   Nausea/Vomiting:  -Resolved since 9/23  -Pt. still denies any associated symptoms of headache, fever or diarrhea  -Tolerated a regular diet without difficulty  RLL Pneumonia:  -CT revealed RLL airspace disease, most likely aspiration.  -IV Zosyn q8h 9/21-9/23 to cover for both possible pneumonia as well as GI concerns.  -No fever, cough, sputum production, or leukocytosis. -Discharge home on oral Augmentin 875/125mg  po BID x 4 days to complete antibiotic course -Follow up CXR in 2 - 3 weeks with PCP  Marijuana and Tobacco Use: -Pt. admitted to smoking approximately 1pack of Newports and 1oz of marijuana most days of the week.   -Advised pt. on harm of using substances -Advised cessation along with the benefits of it -Encouraged regular PCP follow up for help quitting. -Provided resource guide for establishing PCP and smoking cessation.  Consultations:  General Surgery - no surgical needs  Discharge Exam: Filed Vitals:   03/20/13 0555  BP: 133/76  Pulse: 44  Temp: 98.7 F (37.1 C)  Resp: 18    General: AAOx3, sitting comfortably in bed Cardiovascular: Bradycardic, RRR, no M/G/R, normal S1, S2 Respiratory: CTAB without rales, rhonchi, wheeze Abdominal: Soft, nontender, no palpable masses or organomegaly.  No pain on palpation  of epigastrium. Normoactive bowel sounds hears throughout. Extremities:  5/5 strength in each. No edema  Discharge Instructions     Medication List    ASK your doctor about these medications       ARIPiprazole 10 MG tablet  Commonly known as:  ABILIFY  Take 10 mg by mouth daily.     atomoxetine 80 MG capsule  Commonly known as:  STRATTERA   Take 80 mg by mouth daily.     hydrOXYzine 25 MG capsule  Commonly known as:  VISTARIL  Take 25 mg by mouth daily.       No Known Allergies Follow-up Information   Follow up with Priamry MD. Schedule an appointment as soon as possible for a visit in 1 week. (get Chest XRay in 1 week)        The results of significant diagnostics from this hospitalization (including imaging, microbiology, ancillary and laboratory) are listed below for reference.    Significant Diagnostic Studies: US Abdomen Complete  03/18/2013   CLINICAL DATA:  PAIN AND VOMITING. FOLLOW UP CT.  EXAM: ABDOMEN ULTRASOUND  COMPARISON:  CT OF THE ABDOMEN AND PELVIS 03/18/2013  FINDINGS: Gallbladder  GALLBLADDER SHOWS MARKED WALL THICKENING, MEASURING 6.6 MM. NO STONES OR PERICHOLECYSTIC FLUID IDENTIFIED. NO SONOGRAPHIC MURPHY SIGN.  Common bile duct  Diameter: 2.6 MM  Liver  No focal lesion identified. Within normal limits in parenchymal echogenicity.  IVC  No abnormality visualized.  Pancreas  Visualized portion unremarkable.  Spleen  7.0 CM, NORMAL IN APPEARANCE.  Right Kidney  Length: 12.4 CM IN LENGTH. Renal parenchyma is mildly echogenic. No focal mass or hydronephrosis.  Left Kidney  Length: 10.0 cm in length. Mildly echogenic. No focal mass or hydronephrosis.  Abdominal aorta  2.4 cm maximum diameter.  IMPRESSION: 1. Marked gallbladder wall thickening without stones or other evidence for acute cholecystitis. Findings are nonspecific and can be associated with ascites, hypoproteinemia, cirrhosis, hepatitis. The findings can also be seen with acalculous cholecystitis, which is less likely given the lack of a sonographic Murphy sign. 2. Mildly echogenic renal parenchyma without hydronephrosis or focal renal mass.   Electronically Signed   By: Rosalie Gums M.D.   On: 03/18/2013 15:01   Ct Abdomen Pelvis W Contrast  03/18/2013   CLINICAL DATA:  Right upper quadrant abdominal pain. Epigastric pain. Nausea and vomiting.  EXAM: CT  ABDOMEN AND PELVIS WITH CONTRAST  TECHNIQUE: Multidetector CT imaging of the abdomen and pelvis was performed using the standard protocol following bolus administration of intravenous contrast.  CONTRAST:  80mL OMNIPAQUE IOHEXOL 300 MG/ML  SOLN  COMPARISON:  None.  FINDINGS: Lung bases: Motion degradation.  Right base patchy airspace disease.  Normal heart size. Trace right pleural fluid or thickening.  Abdomen/Pelvis: Normal liver, spleen, stomach, pancreas, gallbladder, biliary tract. Periportal edema.  Normal adrenal glands and right kidney. 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  Paucity of retroperitoneal fat. No definite adenopathy.  Underdistended hepatic flexure and ascending colon.  Normal terminal ileum. Appendix likely identified on image 53/ series 2, and normal.  Normal small bowel. Small volume simple appearing abdominal pelvic ascites.  No pelvic adenopathy.  Normal urinary bladder and prostate.  Bones/Musculoskeletal: No acute osseous abnormality. Loss of intervertebral disc height at the lumbosacral junction.  IMPRESSION: 1. Small volume abdominal pelvic ascites, of indeterminate etiology. 2. Right lower lobe airspace disease, suspicious for infection or aspiration. Trace adjacent right pleural fluid or thickening, likely secondary. 3. Tonic, partially malrotated left kidney. 4. Fluid  adjacent the gallbladder. Nonspecific in the setting of ascites. If suspicion of acute cholecystitis, consider ultrasound.   Electronically Signed   By: Jeronimo Greaves   On: 03/18/2013 13:15    Microbiology: Recent Results (from the past 240 hour(s))  URINE CULTURE     Status: None   Collection Time    03/18/13  9:24 AM      Result Value Range Status   Specimen Description URINE, CLEAN CATCH   Final   Special Requests NONE   Final   Culture  Setup Time     Final   Value: 03/18/2013 17:22     Performed at [facility] Count     Final   Value: NO GROWTH     Performed at [facility]   Culture     Final   Value: NO GROWTH     Performed at [facility]   Report Status 03/19/2013 FINAL   Final     Labs: Basic Metabolic Panel:  Recent Labs Lab 03/18/13 0740 03/18/13 1620  NA 136  --   K 4.3  --   CL 102  --   CO2 24  --   GLUCOSE 94  --   BUN 23  --   CREATININE 0.96  --   CALCIUM 9.3  --   MG  --  1.7   Liver Function Tests:  Recent Labs Lab 03/18/13 0740  AST 43*  ALT 25  ALKPHOS 68  BILITOT 0.2*  PROT 7.2  ALBUMIN 4.2    Recent Labs Lab 03/18/13 0740  LIPASE 46   CBC:  Recent Labs Lab 03/18/13 0740  WBC 4.9  NEUTROABS 2.5  HGB 13.3  HCT 39.8  MCV 87.3  PLT 199      Signed:  Stevphen Meuse, Physician Assistant Student Algis Downs, PA-C [facility] 03/20/2013, 12:20 PM

## 2013-03-20 NOTE — Discharge Summary (Signed)
  I have directly reviewed the clinical findings, lab, imaging studies and management of this patient in detail. I have interviewed and examined the patient and agree with the documentation,  as recorded by the Physician extender.  Leroy Sea M.D on 03/20/2013 at 12:30 PM  [facility] Office  [phone]

## 2013-03-21 NOTE — ED Provider Notes (Signed)
Medical screening examination/treatment/procedure(s) were performed by non-physician practitioner and as supervising physician I was immediately available for consultation/collaboration.   Chetara Kropp L Tynan Boesel, MD 03/21/13 1658 

## 2014-03-24 ENCOUNTER — Emergency Department (HOSPITAL_COMMUNITY)
Admission: EM | Admit: 2014-03-24 | Discharge: 2014-03-24 | Disposition: A | Discharge status: Home / Self Care | Payer: Medicaid Other | Attending: Emergency Medicine | Admitting: Emergency Medicine

## 2014-03-24 ENCOUNTER — Encounter (HOSPITAL_COMMUNITY): Payer: Self-pay | Admitting: Emergency Medicine

## 2014-03-24 ENCOUNTER — Emergency Department (HOSPITAL_COMMUNITY): Payer: Medicaid Other

## 2014-03-24 DIAGNOSIS — S6990XA Unspecified injury of unspecified wrist, hand and finger(s), initial encounter: Secondary | ICD-10-CM | POA: Diagnosis present

## 2014-03-24 DIAGNOSIS — Y9389 Activity, other specified: Secondary | ICD-10-CM | POA: Diagnosis not present

## 2014-03-24 DIAGNOSIS — Y9289 Other specified places as the place of occurrence of the external cause: Secondary | ICD-10-CM | POA: Diagnosis not present

## 2014-03-24 DIAGNOSIS — S60229A Contusion of unspecified hand, initial encounter: Secondary | ICD-10-CM | POA: Insufficient documentation

## 2014-03-24 DIAGNOSIS — IMO0002 Reserved for concepts with insufficient information to code with codable children: Secondary | ICD-10-CM | POA: Diagnosis not present

## 2014-03-24 DIAGNOSIS — S61409A Unspecified open wound of unspecified hand, initial encounter: Secondary | ICD-10-CM | POA: Insufficient documentation

## 2014-03-24 DIAGNOSIS — F17200 Nicotine dependence, unspecified, uncomplicated: Secondary | ICD-10-CM | POA: Diagnosis not present

## 2014-03-24 DIAGNOSIS — W261XXA Contact with sword or dagger, initial encounter: Secondary | ICD-10-CM

## 2014-03-24 DIAGNOSIS — Z8659 Personal history of other mental and behavioral disorders: Secondary | ICD-10-CM | POA: Diagnosis not present

## 2014-03-24 DIAGNOSIS — W260XXA Contact with knife, initial encounter: Secondary | ICD-10-CM | POA: Diagnosis not present

## 2014-03-24 DIAGNOSIS — S61411A Laceration without foreign body of right hand, initial encounter: Secondary | ICD-10-CM

## 2014-03-24 DIAGNOSIS — S60221A Contusion of right hand, initial encounter: Secondary | ICD-10-CM

## 2014-03-24 LAB — BASIC METABOLIC PANEL
Anion gap: 14 (ref 5–15)
BUN: 16 mg/dL (ref 6–23)
CALCIUM: 9.8 mg/dL (ref 8.4–10.5)
CO2: 24 meq/L (ref 19–32)
Chloride: 102 mEq/L (ref 96–112)
Creatinine, Ser: 1.06 mg/dL (ref 0.50–1.35)
GFR calc Af Amer: >90 mL/min (ref >90)
Glucose, Bld: 77 mg/dL (ref 70–99)
Potassium: 4.4 mEq/L (ref 3.7–5.3)
SODIUM: 140 meq/L (ref 137–147)

## 2014-03-24 LAB — RAPID URINE DRUG SCREEN, HOSP PERFORMED
AMPHETAMINES: NOT DETECTED
BENZODIAZEPINES: NOT DETECTED
Barbiturates: NOT DETECTED
Cocaine: NOT DETECTED
Opiates: NOT DETECTED
Tetrahydrocannabinol: POSITIVE — AB

## 2014-03-24 LAB — CBC WITH DIFFERENTIAL/PLATELET
Basophils Absolute: 0 10*3/uL (ref 0.0–0.1)
Basophils Relative: 0 % (ref 0–1)
EOS PCT: 1 % (ref 0–5)
Eosinophils Absolute: 0.1 10*3/uL (ref 0.0–0.7)
HEMATOCRIT: 38.6 % — AB (ref 39.0–52.0)
Hemoglobin: 12.4 g/dL — ABNORMAL LOW (ref 13.0–17.0)
LYMPHS ABS: 1.9 10*3/uL (ref 0.7–4.0)
Lymphocytes Relative: 26 % (ref 12–46)
MCH: 28.4 pg (ref 26.0–34.0)
MCHC: 32.1 g/dL (ref 30.0–36.0)
MCV: 88.3 fL (ref 78.0–100.0)
Monocytes Absolute: 0.7 10*3/uL (ref 0.1–1.0)
Monocytes Relative: 10 % (ref 3–12)
Neutro Abs: 4.5 10*3/uL (ref 1.7–7.7)
Neutrophils Relative %: 63 % (ref 43–77)
Platelets: 226 10*3/uL (ref 150–400)
RBC: 4.37 MIL/uL (ref 4.22–5.81)
RDW: 13.2 % (ref 11.5–15.5)
WBC: 7.1 10*3/uL (ref 4.0–10.5)

## 2014-03-24 LAB — ETHANOL

## 2014-03-24 MED ORDER — CEPHALEXIN 500 MG PO CAPS: 500.0000 mg | ORAL_CAPSULE | Freq: Four times a day (QID) | ORAL | Status: DC

## 2014-03-24 MED ORDER — LIDOCAINE HCL 2 % IJ SOLN
10.0000 mL | Freq: Once | INTRAMUSCULAR | Status: DC
  Filled 2014-03-24: qty 20

## 2014-03-24 MED ORDER — CEPHALEXIN 500 MG PO CAPS
500.0000 mg | ORAL_CAPSULE | Freq: Once | ORAL | Status: AC
  Administered 2014-03-24: 500 mg via ORAL
  Filled 2014-03-24: qty 1

## 2014-03-24 NOTE — ED Provider Notes (Signed)
CSN: [id number]     Arrival date & time 03/24/14  1601 History   First MD Initiated Contact with Patient 03/24/14 1615     Chief Complaint  Patient presents with  . Hand Injury     (Consider location/radiation/quality/duration/timing/severity/associated sxs/prior Treatment) HPI  Connor Weber is a 23 y.o. male complaining of a pain to right hand after he punched through a car window prior to arrival. Right hand has a laceration he reports severe pain and decreased range of motion. Last tetanus shot is unknown, patient is right-hand-dominant. He denies any other injuries. Also endorses a numbness to the tips of the right finger.  Past Medical History  Diagnosis Date  . Bipolar 1 disorder   . ADHD (attention deficit hyperactivity disorder)   . Manic, depressive    Past Surgical History  Procedure Laterality Date  . Hand surgery      left   History reviewed. No pertinent family history. History  Substance Use Topics  . Smoking status: Current Every Day Smoker  . Smokeless tobacco: Not on file  . Alcohol Use: Yes    Review of Systems  10 systems reviewed and found to be negative, except as noted in the HPI.    Allergies  Review of patient's allergies indicates no known allergies.  Home Medications   Prior to Admission medications   Medication Sig Start Date End Date Taking? Authorizing Provider  cephALEXin (KEFLEX) 500 MG capsule Take 1 capsule (500 mg total) by mouth 4 (four) times daily. 03/24/14   Connor Doxtater, PA-C   BP 126/77  Pulse 49  Temp(Src) 98.7 F (37.1 C) (Oral)  Resp 16  SpO2 100% Physical Exam  Constitutional:  Patient seems altered, slow response, intoxicated  Musculoskeletal:  Right hand with significant deformity to dorsum question pseudoaneurysm, bleeding is controlled patient has good range of motion to wrist and fingers, reports decreased sensation in fingers, sensation is grossly intact. Radial pulse 2+.        ED Course  Procedures  (including critical care time) Labs Review Labs Reviewed  CBC WITH DIFFERENTIAL - Abnormal; Notable for the following:    Hemoglobin 12.4 (*)    HCT 38.6 (*)    All other components within normal limits  URINE RAPID DRUG SCREEN (HOSP PERFORMED) - Abnormal; Notable for the following:    Tetrahydrocannabinol POSITIVE (*)    All other components within normal limits  BASIC METABOLIC PANEL  ETHANOL    Imaging Review Dg Wrist Complete Right  03/24/2014   CLINICAL DATA:  Trauma, punched a window  EXAM: RIGHT WRIST - COMPLETE 3+ VIEW  COMPARISON:  None.  FINDINGS: No distal radius or ulnar fracture. Radiocarpal joint is intact. No carpal fracture. There is a small linear focus of periosteal or peri periosteal bubbly ossification along the ulnar aspect of the radial metaphysis. This appears benign and non traumatic. There soft tissue swelling of the dorsum of the hand and skin defect. No foreign body.  IMPRESSION: 1. No evidence of fracture. 2. No foreign body. 3. Soft tissue swelling of the dorsum of the hand.   Electronically Signed   By: Connor Weber M.D.   On: 03/24/2014 18:06   Dg Hand Complete Right  03/24/2014   CLINICAL DATA:  Punched glass window with right wrist/hand pain and swelling.  EXAM: RIGHT HAND - COMPLETE 3+ VIEW  COMPARISON:  Right wrist 02/14/2011  FINDINGS: Mild bony remodeling over the distal radius compatible with old fracture. No acute fracture or  dislocation is seen.  IMPRESSION: No acute findings.   Electronically Signed   By: Connor Weber M.D.   On: 03/24/2014 18:03     EKG Interpretation None      MDM   Final diagnoses:  Hand laceration, right, initial encounter  Traumatic hematoma of right hand, initial encounter    Filed Vitals:   03/24/14 1606 03/24/14 1822 03/24/14 1942  BP: 136/79 134/82 126/77  Pulse: 74 60 49  Temp: 98.9 F (37.2 C) 98.7 F (37.1 C)   TempSrc: Oral Oral   Resp: SpO2: 100% 100% 100%    Medications  cephALEXin  (KEFLEX) capsule 500 mg (500 mg Oral Given 03/24/14 1940)    Connor Weber is a 23 y.o. male presenting with pain swelling and laceration to dorsum of right hand. Patient cannot remember when his last tetanus shot was given however chart review shows that he received in 2014. X-ray with no fracture. Discussed the option of hematoma evacuation with patient and his mother, and shared decision-making capacity they have declined that at this time. I have informed him that this puts him at higher risk for infection. Patient will be started prophylactically on Keflex. As wound is at higher risk for infection I will not close tightly. Steri-Strip is applied and the distal edges of the Steri-Strip R. Dermabond to the patient's skin. Patient is asked to follow with Dr. Mina Weber, we've had an extensive discussion of return precautions for infection.   This is a shared visit with the attending physician who personally evaluated the patient and agrees with the care plan.   Evaluation does not show pathology that would require ongoing emergent intervention or inpatient treatment. Pt is hemodynamically stable and mentating appropriately. Discussed findings and plan with patient/guardian, who agrees with care plan. All questions answered. Return precautions discussed and outpatient follow up given.   Discharge Medication List as of 03/24/2014  7:35 PM    START taking these medications   Details  cephALEXin (KEFLEX) 500 MG capsule Take 1 capsule (500 mg total) by mouth 4 (four) times daily., Starting 03/24/2014, Until Discontinued, [employer], PA-C 03/25/14 [phone]

## 2014-03-24 NOTE — ED Notes (Signed)
Patient transported to X-ray 

## 2014-03-24 NOTE — ED Provider Notes (Signed)
MSE was initiated and I personally evaluated the patient and placed orders (if any) at  4:05 PM on March 24, 2014.  Pt here after punching a window, gross deformity to the right hand with numbness endorsed by patient, and limited range of motion at the second MCP joint, suspicious for tendinous and nerve damage. Bleeding controlled with pressure, but given the gross deformity of the hand, and need for hand consult as well as possible conscious sedation for reduction, will have him seen in the acute side.   The patient appears stable so that the remainder of the MSE may be completed by another provider.  Donnita [place], [place] 03/24/14 1607

## 2014-03-24 NOTE — ED Notes (Signed)
Pt reports punching car window, pt has abrasion to fingers and large area of swelling on posterior hand. Unable to move hand fully.

## 2014-03-24 NOTE — Discharge Instructions (Signed)
Elevate the right hand above the level of the heart is much as possible. Apply ice for the first 2 days, after that please transitioned to Heat.   Keep wound dry and do not remove dressing for 24 hours if possible. After that, wash gently morning and night (every 12 hours) with soap and water.    Do NOT use any ointment (because that will take off the Dermabond) rubbing alcohol or hydrogen peroxide, do not soak the area   Present to your primary care doctor or the urgent care of your choice, or the ED for suture removal in 7-10 days.   Every attempt was made to remove foreign body (contaminants) from the wound.  However, there is always a chance that some may remain in the wound. This can  increase your risk of infection.   If you see signs of infection (warmth, redness, tenderness, pus, sharp increase in pain, fever, red streaking in the skin) immediately return to the emergency department.   After the wound heals fully, apply sunscreen for 6-12 months to minimize scarring.   Hand Contusion A hand contusion is a deep bruise on your hand area. Contusions are the result of an injury that caused bleeding under the skin. The contusion may turn blue, purple, or yellow. Minor injuries will give you a painless contusion, but more severe contusions may stay painful and swollen for a few weeks. CAUSES  A contusion is usually caused by a blow, trauma, or direct force to an area of the body. SYMPTOMS   Swelling and redness of the injured area.  Discoloration of the injured area.  Tenderness and soreness of the injured area.  Pain. DIAGNOSIS  The diagnosis can be made by taking a history and performing a physical exam. An X-ray, CT scan, or MRI may be needed to determine if there were any associated injuries, such as broken bones (fractures). TREATMENT  Often, the best treatment for a hand contusion is resting, elevating, icing, and applying cold compresses to the injured area. Over-the-counter  medicines may also be recommended for pain control. HOME CARE INSTRUCTIONS   Put ice on the injured area.  Put ice in a plastic bag.  Place a towel between your skin and the bag.  Leave the ice on for 15-20 minutes, 03-04 times a day.  Only take over-the-counter or prescription medicines as directed by your caregiver. Your caregiver may recommend avoiding anti-inflammatory medicines (aspirin, ibuprofen, and naproxen) for 48 hours because these medicines may increase bruising.  If told, use an elastic wrap as directed. This can help reduce swelling. You may remove the wrap for sleeping, showering, and bathing. If your fingers become numb, cold, or blue, take the wrap off and reapply it more loosely.  Elevate your hand with pillows to reduce swelling.  Avoid overusing your hand if it is painful. SEEK IMMEDIATE MEDICAL CARE IF:   You have increased redness, swelling, or pain in your hand.  Your swelling or pain is not relieved with medicines.  You have loss of feeling in your hand or are unable to move your fingers.  Your hand turns cold or blue.  You have pain when you move your fingers.  Your hand becomes warm to the touch.  Your contusion does not improve in 2 days. MAKE SURE YOU:   Understand these instructions.  Will watch your condition.  Will get help right away if you are not doing well or get worse. Document Released: 12/03/2001 Document Revised: 03/07/2012 Document Reviewed:  12/05/2011 Exit[employer] Patient Information 2015 [place], [place]. This information is not intended to replace advice given to you by your health care provider. Make sure you discuss any questions you have with your health care provider.  Laceration Care, Adult A laceration is a cut or lesion that goes through all layers of the skin and into the tissue just beneath the skin. TREATMENT  Some lacerations may not require closure. Some lacerations may not be able to be closed due to an increased risk  of infection. It is important to see your caregiver as soon as possible after an injury to minimize the risk of infection and maximize the opportunity for successful closure. If closure is appropriate, pain medicines may be given, if needed. The wound will be cleaned to help prevent infection. Your caregiver will use stitches (sutures), staples, wound glue (adhesive), or skin adhesive strips to repair the laceration. These tools bring the skin edges together to allow for faster healing and a better cosmetic outcome. However, all wounds will heal with a scar. Once the wound has healed, scarring can be minimized by covering the wound with sunscreen during the day for 1 full year. HOME CARE INSTRUCTIONS  For sutures or staples:  Keep the wound clean and dry.  If you were given a bandage (dressing), you should change it at least once a day. Also, change the dressing if it becomes wet or dirty, or as directed by your caregiver.  Wash the wound with soap and water 2 times a day. Rinse the wound off with water to remove all soap. Pat the wound dry with a clean towel.  After cleaning, apply a thin layer of the antibiotic ointment as recommended by your caregiver. This will help prevent infection and keep the dressing from sticking.  You may shower as usual after the first 24 hours. Do not soak the wound in water until the sutures are removed.  Only take over-the-counter or prescription medicines for pain, discomfort, or fever as directed by your caregiver.  Get your sutures or staples removed as directed by your caregiver. For skin adhesive strips:  Keep the wound clean and dry.  Do not get the skin adhesive strips wet. You may bathe carefully, using caution to keep the wound dry.  If the wound gets wet, pat it dry with a clean towel.  Skin adhesive strips will fall off on their own. You may trim the strips as the wound heals. Do not remove skin adhesive strips that are still stuck to the wound.  They will fall off in time. For wound adhesive:  You may briefly wet your wound in the shower or bath. Do not soak or scrub the wound. Do not swim. Avoid periods of heavy perspiration until the skin adhesive has fallen off on its own. After showering or bathing, gently pat the wound dry with a clean towel.  Do not apply liquid medicine, cream medicine, or ointment medicine to your wound while the skin adhesive is in place. This may loosen the film before your wound is healed.  If a dressing is placed over the wound, be careful not to apply tape directly over the skin adhesive. This may cause the adhesive to be pulled off before the wound is healed.  Avoid prolonged exposure to sunlight or tanning lamps while the skin adhesive is in place. Exposure to ultraviolet light in the first year will darken the scar.  The skin adhesive will usually remain in place for 5 to 10 days,  then naturally fall off the skin. Do not pick at the adhesive film. You may need a tetanus shot if:  You cannot remember when you had your last tetanus shot.  You have never had a tetanus shot. If you get a tetanus shot, your arm may swell, get red, and feel warm to the touch. This is common and not a problem. If you need a tetanus shot and you choose not to have one, there is a rare chance of getting tetanus. Sickness from tetanus can be serious. SEEK MEDICAL CARE IF:   You have redness, swelling, or increasing pain in the wound.  You see a red line that goes away from the wound.  You have yellowish-white fluid (pus) coming from the wound.  You have a fever.  You notice a bad smell coming from the wound or dressing.  Your wound breaks open before or after sutures have been removed.  You notice something coming out of the wound such as wood or glass.  Your wound is on your hand or foot and you cannot move a finger or toe. SEEK IMMEDIATE MEDICAL CARE IF:   Your pain is not controlled with prescribed  medicine.  You have severe swelling around the wound causing pain and numbness or a change in color in your arm, hand, leg, or foot.  Your wound splits open and starts bleeding.  You have worsening numbness, weakness, or loss of function of any joint around or beyond the wound.  You develop painful lumps near the wound or on the skin anywhere on your body. MAKE SURE YOU:   Understand these instructions.  Will watch your condition.  Will get help right away if you are not doing well or get worse. Document Released: 06/13/2005 Document Revised: 09/05/2011 Document Reviewed: 12/07/2010 [facility] Patient Information 2015 [place], [place]. This information is not intended to replace advice given to you by your health care provider. Make sure you discuss any questions you have with your health care provider.

## 2014-03-26 NOTE — ED Provider Notes (Signed)
Medical screening examination/treatment/procedure(s) were conducted as a shared visit with non-physician practitioner(s) and myself.  I personally evaluated the patient during the encounter.   EKG Interpretation None       Briefly, pt is a 23 y.o. male presenting with R hand pain after punching a window.  I performed an examination on the patient including cardiac, pulmonary, and gi systems which were unremarkable.  R hand with hematoma which is now hemostatic.  NV intact, no foreign bodies, XR unremarkable for acute fx.  As there is a small puncture appearing wound, this was only closed with steristrips.  DC home in stable condition.     Mirian MoMatthew Gentry, MD 03/26/14 [phone]

## 2014-04-03 ENCOUNTER — Ambulatory Visit: Payer: Medicaid Other | Attending: Orthopedic Surgery | Admitting: Occupational Therapy

## 2014-04-03 DIAGNOSIS — M25649 Stiffness of unspecified hand, not elsewhere classified: Secondary | ICD-10-CM | POA: Insufficient documentation

## 2014-04-03 DIAGNOSIS — Z5189 Encounter for other specified aftercare: Secondary | ICD-10-CM | POA: Diagnosis present

## 2014-04-03 DIAGNOSIS — M6281 Muscle weakness (generalized): Secondary | ICD-10-CM | POA: Insufficient documentation

## 2014-04-24 ENCOUNTER — Ambulatory Visit: Payer: Medicaid Other | Admitting: Occupational Therapy

## 2014-05-15 ENCOUNTER — Ambulatory Visit: Payer: Medicaid Other | Attending: Orthopedic Surgery | Admitting: Occupational Therapy

## 2014-05-15 DIAGNOSIS — Z5189 Encounter for other specified aftercare: Secondary | ICD-10-CM | POA: Insufficient documentation

## 2014-05-15 DIAGNOSIS — M25649 Stiffness of unspecified hand, not elsewhere classified: Secondary | ICD-10-CM | POA: Insufficient documentation

## 2014-05-15 DIAGNOSIS — M6281 Muscle weakness (generalized): Secondary | ICD-10-CM | POA: Insufficient documentation

## 2014-08-08 ENCOUNTER — Inpatient Hospital Stay (HOSPITAL_COMMUNITY)
Admission: EM | Admit: 2014-08-08 | Discharge: 2014-08-09 | DRG: 392 | Disposition: A | Discharge status: Home / Self Care | Payer: Medicaid Other | Attending: Internal Medicine | Admitting: Internal Medicine

## 2014-08-08 ENCOUNTER — Emergency Department (HOSPITAL_COMMUNITY): Payer: Medicaid Other

## 2014-08-08 ENCOUNTER — Encounter (HOSPITAL_COMMUNITY): Payer: Self-pay | Admitting: *Deleted

## 2014-08-08 DIAGNOSIS — F1910 Other psychoactive substance abuse, uncomplicated: Secondary | ICD-10-CM | POA: Diagnosis present

## 2014-08-08 DIAGNOSIS — R109 Unspecified abdominal pain: Secondary | ICD-10-CM | POA: Diagnosis present

## 2014-08-08 DIAGNOSIS — Z720 Tobacco use: Secondary | ICD-10-CM

## 2014-08-08 DIAGNOSIS — R188 Other ascites: Secondary | ICD-10-CM | POA: Diagnosis present

## 2014-08-08 DIAGNOSIS — E739 Lactose intolerance, unspecified: Secondary | ICD-10-CM | POA: Diagnosis present

## 2014-08-08 DIAGNOSIS — Z7289 Other problems related to lifestyle: Secondary | ICD-10-CM | POA: Diagnosis present

## 2014-08-08 DIAGNOSIS — Z789 Other specified health status: Secondary | ICD-10-CM | POA: Diagnosis present

## 2014-08-08 DIAGNOSIS — F17210 Nicotine dependence, cigarettes, uncomplicated: Secondary | ICD-10-CM | POA: Diagnosis present

## 2014-08-08 DIAGNOSIS — K529 Noninfective gastroenteritis and colitis, unspecified: Principal | ICD-10-CM | POA: Diagnosis present

## 2014-08-08 DIAGNOSIS — F1210 Cannabis abuse, uncomplicated: Secondary | ICD-10-CM | POA: Diagnosis present

## 2014-08-08 DIAGNOSIS — F909 Attention-deficit hyperactivity disorder, unspecified type: Secondary | ICD-10-CM | POA: Diagnosis present

## 2014-08-08 DIAGNOSIS — R001 Bradycardia, unspecified: Secondary | ICD-10-CM | POA: Diagnosis present

## 2014-08-08 DIAGNOSIS — F319 Bipolar disorder, unspecified: Secondary | ICD-10-CM | POA: Diagnosis present

## 2014-08-08 HISTORY — DX: Depression, unspecified: F32.A

## 2014-08-08 HISTORY — DX: Major depressive disorder, single episode, unspecified: F32.9

## 2014-08-08 HISTORY — DX: Anxiety disorder, unspecified: F41.9

## 2014-08-08 LAB — COMPREHENSIVE METABOLIC PANEL
ALT: 25 U/L (ref 0–53)
ANION GAP: 8 (ref 5–15)
AST: 39 U/L — AB (ref 0–37)
Albumin: 4.4 g/dL (ref 3.5–5.2)
Alkaline Phosphatase: 70 U/L (ref 39–117)
BUN: 14 mg/dL (ref 6–23)
CALCIUM: 9.5 mg/dL (ref 8.4–10.5)
CO2: 24 mmol/L (ref 19–32)
CREATININE: 1.07 mg/dL (ref 0.50–1.35)
Chloride: 106 mmol/L (ref 96–112)
GFR calc Af Amer: >90 mL/min (ref >90)
GFR calc non Af Amer: >90 mL/min (ref >90)
Glucose, Bld: 70 mg/dL (ref 70–99)
Potassium: 4.4 mmol/L (ref 3.5–5.1)
Sodium: 138 mmol/L (ref 135–145)
Total Bilirubin: 0.6 mg/dL (ref 0.3–1.2)
Total Protein: 7 g/dL (ref 6.0–8.3)

## 2014-08-08 LAB — MAGNESIUM: Magnesium: 1.8 mg/dL (ref 1.5–2.5)

## 2014-08-08 LAB — CBC WITH DIFFERENTIAL/PLATELET
BASOS ABS: 0 10*3/uL (ref 0.0–0.1)
Basophils Relative: 0 % (ref 0–1)
Eosinophils Absolute: 0.1 10*3/uL (ref 0.0–0.7)
Eosinophils Relative: 2 % (ref 0–5)
HEMATOCRIT: 41.1 % (ref 39.0–52.0)
HEMOGLOBIN: 13.6 g/dL (ref 13.0–17.0)
LYMPHS ABS: 1.8 10*3/uL (ref 0.7–4.0)
LYMPHS PCT: 41 % (ref 12–46)
MCH: 28.8 pg (ref 26.0–34.0)
MCHC: 33.1 g/dL (ref 30.0–36.0)
MCV: 87.1 fL (ref 78.0–100.0)
MONO ABS: 0.4 10*3/uL (ref 0.1–1.0)
Monocytes Relative: 9 % (ref 3–12)
Neutro Abs: 2.2 10*3/uL (ref 1.7–7.7)
Neutrophils Relative %: 48 % (ref 43–77)
Platelets: 153 10*3/uL (ref 150–400)
RBC: 4.72 MIL/uL (ref 4.22–5.81)
RDW: 13.5 % (ref 11.5–15.5)
Smear Review: ADEQUATE
WBC: 4.5 10*3/uL (ref 4.0–10.5)

## 2014-08-08 LAB — LIPASE, BLOOD: LIPASE: 29 U/L (ref 11–59)

## 2014-08-08 LAB — LIPID PANEL
CHOLESTEROL: 84 mg/dL (ref 0–200)
HDL: 45 mg/dL (ref >39)
LDL Cholesterol: 35 mg/dL (ref 0–99)
TRIGLYCERIDES: 19 mg/dL (ref <150)
Total CHOL/HDL Ratio: 1.9 RATIO
VLDL: 4 mg/dL (ref 0–40)

## 2014-08-08 LAB — ETHANOL

## 2014-08-08 LAB — APTT: aPTT: 41 seconds — ABNORMAL HIGH (ref 24–37)

## 2014-08-08 LAB — PROTIME-INR
INR: 1.2 (ref 0.00–1.49)
Prothrombin Time: 15.3 seconds — ABNORMAL HIGH (ref 11.6–15.2)

## 2014-08-08 LAB — LACTIC ACID, PLASMA: Lactic Acid, Venous: 0.9 mmol/L (ref 0.5–2.0)

## 2014-08-08 LAB — TSH: TSH: 1.197 u[IU]/mL (ref 0.350–4.500)

## 2014-08-08 LAB — PHOSPHORUS: Phosphorus: 3.2 mg/dL (ref 2.3–4.6)

## 2014-08-08 MED ORDER — SODIUM CHLORIDE 0.9 % IV BOLUS (SEPSIS)
500.0000 mL | Freq: Once | INTRAVENOUS | Status: AC
  Administered 2014-08-08: 500 mL via INTRAVENOUS

## 2014-08-08 MED ORDER — METRONIDAZOLE IN NACL 5-0.79 MG/ML-% IV SOLN
500.0000 mg | Freq: Once | INTRAVENOUS | Status: AC
  Administered 2014-08-08: 500 mg via INTRAVENOUS
  Filled 2014-08-08: qty 100

## 2014-08-08 MED ORDER — ACETAMINOPHEN 325 MG PO TABS: 650.0000 mg | ORAL_TABLET | Freq: Four times a day (QID) | ORAL | Status: DC | PRN

## 2014-08-08 MED ORDER — LORAZEPAM 2 MG/ML IJ SOLN: 1.0000 mg | Freq: Four times a day (QID) | INTRAMUSCULAR | Status: DC | PRN

## 2014-08-08 MED ORDER — SODIUM CHLORIDE 0.9 % IV SOLN
INTRAVENOUS | Status: DC
  Administered 2014-08-08: 10:00:00 via INTRAVENOUS

## 2014-08-08 MED ORDER — ACETAMINOPHEN 650 MG RE SUPP: 650.0000 mg | Freq: Four times a day (QID) | RECTAL | Status: DC | PRN

## 2014-08-08 MED ORDER — HYDROMORPHONE HCL 1 MG/ML IJ SOLN: 1.0000 mg | INTRAMUSCULAR | Status: DC | PRN

## 2014-08-08 MED ORDER — VITAMIN B-1 100 MG PO TABS
100.0000 mg | ORAL_TABLET | Freq: Every day | ORAL | Status: DC
  Administered 2014-08-08 – 2014-08-09 (×2): 100 mg via ORAL
  Filled 2014-08-08 (×2): qty 1

## 2014-08-08 MED ORDER — ONDANSETRON HCL 4 MG/2ML IJ SOLN: 4.0000 mg | Freq: Three times a day (TID) | INTRAMUSCULAR | Status: DC | PRN

## 2014-08-08 MED ORDER — IOHEXOL 300 MG/ML  SOLN
25.0000 mL | Freq: Once | INTRAMUSCULAR | Status: AC | PRN
  Administered 2014-08-08: 25 mL via ORAL

## 2014-08-08 MED ORDER — DICYCLOMINE HCL 10 MG/ML IM SOLN
20.0000 mg | Freq: Once | INTRAMUSCULAR | Status: AC
  Administered 2014-08-08: 20 mg via INTRAMUSCULAR
  Filled 2014-08-08: qty 2

## 2014-08-08 MED ORDER — LORAZEPAM 1 MG PO TABS
1.0000 mg | ORAL_TABLET | Freq: Four times a day (QID) | ORAL | Status: DC | PRN
Start: 2014-08-08 — End: 2014-08-09

## 2014-08-08 MED ORDER — IOHEXOL 300 MG/ML  SOLN
80.0000 mL | Freq: Once | INTRAMUSCULAR | Status: AC | PRN
  Administered 2014-08-08: 80 mL via INTRAVENOUS

## 2014-08-08 MED ORDER — ENOXAPARIN SODIUM 40 MG/0.4ML ~~LOC~~ SOLN
40.0000 mg | SUBCUTANEOUS | Status: DC
  Administered 2014-08-08: 40 mg via SUBCUTANEOUS
  Filled 2014-08-08 (×2): qty 0.4

## 2014-08-08 MED ORDER — FOLIC ACID 1 MG PO TABS
1.0000 mg | ORAL_TABLET | Freq: Every day | ORAL | Status: DC
  Administered 2014-08-09: 1 mg via ORAL
  Filled 2014-08-08 (×2): qty 1

## 2014-08-08 MED ORDER — PANTOPRAZOLE SODIUM 40 MG PO TBEC
40.0000 mg | DELAYED_RELEASE_TABLET | Freq: Every day | ORAL | Status: DC
  Administered 2014-08-08 – 2014-08-09 (×2): 40 mg via ORAL
  Filled 2014-08-08 (×2): qty 1

## 2014-08-08 MED ORDER — ADULT MULTIVITAMIN W/MINERALS CH
1.0000 | ORAL_TABLET | Freq: Every day | ORAL | Status: DC
Start: 2014-08-08 — End: 2014-08-09
  Administered 2014-08-08 – 2014-08-09 (×2): 1 via ORAL
  Filled 2014-08-08 (×2): qty 1

## 2014-08-08 MED ORDER — SODIUM CHLORIDE 0.9 % IV SOLN: INTRAVENOUS | Status: DC

## 2014-08-08 MED ORDER — CIPROFLOXACIN IN D5W 400 MG/200ML IV SOLN
400.0000 mg | Freq: Once | INTRAVENOUS | Status: AC
  Administered 2014-08-08: 400 mg via INTRAVENOUS
  Filled 2014-08-08: qty 200

## 2014-08-08 MED ORDER — ONDANSETRON HCL 4 MG/2ML IJ SOLN
4.0000 mg | INTRAMUSCULAR | Status: DC | PRN
  Administered 2014-08-08: 4 mg via INTRAVENOUS
  Filled 2014-08-08: qty 2

## 2014-08-08 MED ORDER — SODIUM CHLORIDE 0.9 % IJ SOLN
3.0000 mL | Freq: Two times a day (BID) | INTRAMUSCULAR | Status: DC
  Administered 2014-08-09: 3 mL via INTRAVENOUS

## 2014-08-08 MED ORDER — FAMOTIDINE IN NACL 20-0.9 MG/50ML-% IV SOLN
20.0000 mg | Freq: Once | INTRAVENOUS | Status: AC
  Administered 2014-08-08: 20 mg via INTRAVENOUS
  Filled 2014-08-08: qty 50

## 2014-08-08 MED ORDER — THIAMINE HCL 100 MG/ML IJ SOLN
100.0000 mg | Freq: Every day | INTRAMUSCULAR | Status: DC
Start: 2014-08-08 — End: 2014-08-09
  Filled 2014-08-08 (×2): qty 1

## 2014-08-08 MED ORDER — METRONIDAZOLE IN NACL 5-0.79 MG/ML-% IV SOLN
500.0000 mg | Freq: Three times a day (TID) | INTRAVENOUS | Status: DC
  Administered 2014-08-08: 500 mg via INTRAVENOUS
  Filled 2014-08-08 (×4): qty 100

## 2014-08-08 MED ORDER — CIPROFLOXACIN IN D5W 400 MG/200ML IV SOLN
400.0000 mg | Freq: Two times a day (BID) | INTRAVENOUS | Status: DC
  Administered 2014-08-08: 400 mg via INTRAVENOUS
  Filled 2014-08-08 (×3): qty 200

## 2014-08-08 NOTE — ED Notes (Signed)
CT notified pt finished drinking contrast  

## 2014-08-08 NOTE — ED Notes (Signed)
Mom is Anette Guarneriina Fleek and can be reached at [phone]

## 2014-08-08 NOTE — ED Notes (Signed)
Pt reports lower abd pain that woke him up this am. Denies n/v/d.

## 2014-08-08 NOTE — ED Notes (Signed)
MD at bedside. 

## 2014-08-08 NOTE — ED Provider Notes (Signed)
CSN: [id number]     Arrival date & time 08/08/14  9147 History   First MD Initiated Contact with Patient 08/08/14 [phone]     Chief Complaint  Patient presents with  . Abdominal Pain      HPI Pt was seen at 0930.  Per pt, c/o gradual onset and persistence of constant generalized abd "pain" since 7am this morning. Describes the abd pain as "cramping." Pt's mother states "the last time he was like this they did a CT scan and admitted him."  Denies N/V, no diarrhea, no fevers, no back pain, no rash, no CP/SOB, no black or blood in stools.       Past Medical History  Diagnosis Date  . Bipolar 1 disorder   . ADHD (attention deficit hyperactivity disorder)   . Manic, depressive    Past Surgical History  Procedure Laterality Date  . Hand surgery      left    History  Substance Use Topics  . Smoking status: Current Every Day Smoker  . Smokeless tobacco: Not on file  . Alcohol Use: Yes    Review of Systems ROS: Statement: All systems negative except as marked or noted in the HPI; Constitutional: Negative for fever and chills. ; ; Eyes: Negative for eye pain, redness and discharge. ; ; ENMT: Negative for ear pain, hoarseness, nasal congestion, sinus pressure and sore throat. ; ; Cardiovascular: Negative for chest pain, palpitations, diaphoresis, dyspnea and peripheral edema. ; ; Respiratory: Negative for cough, wheezing and stridor. ; ; Gastrointestinal: +abd pain. Negative for nausea, vomiting, diarrhea, blood in stool, hematemesis, jaundice and rectal bleeding. . ; ; Genitourinary: Negative for dysuria, flank pain and hematuria. ; ; Musculoskeletal: Negative for back pain and neck pain. Negative for swelling and trauma.; ; Skin: Negative for pruritus, rash, abrasions, blisters, bruising and skin lesion.; ; Neuro: Negative for headache, lightheadedness and neck stiffness. Negative for weakness, altered level of consciousness , altered mental status, extremity weakness, paresthesias, involuntary  movement, seizure and syncope.     Allergies  Review of patient's allergies indicates no known allergies.  Home Medications   Prior to Admission medications   Medication Sig Start Date End Date Taking? Authorizing Provider  cephALEXin (KEFLEX) 500 MG capsule Take 1 capsule (500 mg total) by mouth 4 (four) times daily. Patient not taking: Reported on 08/08/2014 03/24/14   Joni Reining Pisciotta, PA-C   BP 113/65 mmHg  Pulse 47  Temp(Src) 98.4 F (36.9 C) (Oral)  Resp 16  SpO2 100% Physical Exam 0935: Physical examination:  Nursing notes reviewed; Vital signs and O2 SAT reviewed;  Constitutional: Well developed, Well nourished, Well hydrated, In no acute distress; Head:  Normocephalic, atraumatic; Eyes: EOMI, PERRL, No scleral icterus; ENMT: Mouth and pharynx normal, Mucous membranes moist; Neck: Supple, Full range of motion, No lymphadenopathy; Cardiovascular: Regular rate and rhythm, No murmur, rub, or gallop; Respiratory: Breath sounds clear & equal bilaterally, No rales, rhonchi, wheezes.  Speaking full sentences with ease, Normal respiratory effort/excursion; Chest: Nontender, Movement normal; Abdomen: Soft, +diffuse tenderness to palp. No rebound or guarding. Nondistended, Normal bowel sounds; Genitourinary: No CVA tenderness; Extremities: Pulses normal, No tenderness, No edema, No calf edema or asymmetry.; Neuro: AA&Ox3, Major CN grossly intact.  Speech clear. No gross focal motor or sensory deficits in extremities.; Skin: Color normal, Warm, Dry.   ED Course  Procedures     EKG Interpretation None      MDM  MDM Reviewed: previous chart, nursing note and vitals Reviewed previous:  labs and CT scan Interpretation: labs and CT scan     Results for orders placed or performed during the hospital encounter of 08/08/14  Comprehensive metabolic panel  Result Value Ref Range   Sodium 138 135 - 145 mmol/L   Potassium 4.4 3.5 - 5.1 mmol/L   Chloride 106 96 - 112 mmol/L   CO2 24 19 -  32 mmol/L   Glucose, Bld 70 70 - 99 mg/dL   BUN 14 6 - 23 mg/dL   Creatinine, Ser 1.61 0.50 - 1.35 mg/dL   Calcium 9.5 8.4 - 09.6 mg/dL   Total Protein 7.0 6.0 - 8.3 g/dL   Albumin 4.4 3.5 - 5.2 g/dL   AST 39 (H) 0 - 37 U/L   ALT 25 0 - 53 U/L   Alkaline Phosphatase 70 39 - 117 U/L   Total Bilirubin 0.6 0.3 - 1.2 mg/dL   GFR calc non Af Amer >90 >90 mL/min   GFR calc Af Amer >90 >90 mL/min   Anion gap 8 5 - 15  Lipase, blood  Result Value Ref Range   Lipase 29 11 - 59 U/L  CBC with Differential  Result Value Ref Range   WBC 4.5 4.0 - 10.5 K/uL   RBC 4.72 4.22 - 5.81 MIL/uL   Hemoglobin 13.6 13.0 - 17.0 g/dL   HCT 04.5 40.9 - 81.1 %   MCV 87.1 78.0 - 100.0 fL   MCH 28.8 26.0 - 34.0 pg   MCHC 33.1 30.0 - 36.0 g/dL   RDW 91.4 78.2 - 95.6 %   Platelets 153 150 - 400 K/uL   Neutrophils Relative % 48 43 - 77 %   Lymphocytes Relative 41 12 - 46 %   Monocytes Relative 9 3 - 12 %   Eosinophils Relative 2 0 - 5 %   Basophils Relative 0 0 - 1 %   Neutro Abs 2.2 1.7 - 7.7 K/uL   Lymphs Abs 1.8 0.7 - 4.0 K/uL   Monocytes Absolute 0.4 0.1 - 1.0 K/uL   Eosinophils Absolute 0.1 0.0 - 0.7 K/uL   Basophils Absolute 0.0 0.0 - 0.1 K/uL   Smear Review      PLATELET CLUMPS NOTED ON SMEAR, COUNT APPEARS ADEQUATE   Ct Abdomen Pelvis W Contrast 08/08/2014   CLINICAL DATA:  24 year old with lower abdominal pain.  EXAM: CT ABDOMEN AND PELVIS WITH CONTRAST  TECHNIQUE: Multidetector CT imaging of the abdomen and pelvis was performed using the standard protocol following bolus administration of intravenous contrast.  CONTRAST:  80mL OMNIPAQUE IOHEXOL 300 MG/ML  SOLN  COMPARISON:  CT 03/18/2013  FINDINGS: Small peripheral density in the right lower lobe is likely postinflammatory and an incidental finding. Negative for free air.  Images were obtained during the arterial phase of contrast. As a result, evaluation of the solid organs is somewhat limited. There is a small focus of enhancement along the  anterior right hepatic lobe which is likely an incidental finding. No gross abnormality to the liver and portal venous system appears to be patent. No gross abnormality to the spleen or pancreas or adrenal glands. Normal appearance of the right kidney. 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 No gross abnormality to the gallbladder.  Again noted is a small volume of fluid in the pelvis. Mild distention of the urinary bladder. There may be mild stranding around the splenic flexure of the colon and this is similar to the previous examination. There is contrast in the  small and large bowel. There is contrast throughout the appendix. No evidence for appendix inflammation. Normal appearance of the small bowel. No significant lymphadenopathy.  No acute bone abnormality.  IMPRESSION: Small amount of free fluid in the pelvis. Mild stranding in the left upper abdomen near the splenic flexure. A small focus of colonic inflammation cannot be excluded.  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   Electronically Signed   By: Richarda OverlieAdam  Henn M.D.   On: 08/08/2014 13:03    1500:  Pt family expectation was repeat CT A/P. CT completed; will start IV cipro/flagyl with colonic inflammation. N/V x1 in the ED. No stooling.  [facility], case discussed, including:  HPI, pertinent PM/SHx, VS/PE, dx testing, ED course and treatment:  Agreeable to admit, requests to write temporary orders, obtain observation medical bed to Dr. Donnelly StagerButcher's service.   Samuel JesterKathleen Necie Wilcoxson, DO 08/09/14 1544

## 2014-08-08 NOTE — Progress Notes (Signed)
RN attempt to call for report. 

## 2014-08-08 NOTE — Progress Notes (Signed)
NURSING PROGRESS NOTE  Connor Weber [id number] Admission Data: 08/08/2014 6:56 PM Attending Provider: Burns SpainElizabeth A Butcher, MD PCP:No PCP Per Patient Code Status: full  Connor Weber is a 24 y.o. male patient admitted from ED:  -No acute distress noted.  -No complaints of shortness of breath.  -No complaints of chest pain.    Blood pressure 110/75, pulse 42, temperature 97.7 F (36.5 C), temperature source Oral, resp. rate 16, height 5\' 7"  (1.702 m), weight 59.33 kg (130 lb 12.8 oz), SpO2 100 %.   IV Fluids:  IV in place, occlusive dsg intact without redness, IV cath antecubital right, condition patent and no redness and left, condition patent and no redness normal saline lock.   Allergies:  Review of patient's allergies indicates no known allergies.  Past Medical History:   has a past medical history of Bipolar 1 disorder; ADHD (attention deficit hyperactivity disorder); and Manic, depressive.  Past Surgical History:   has past surgical history that includes Hand surgery.  Social History:   reports that he has been smoking.  He does not have any smokeless tobacco history on file. He reports that he drinks alcohol. He reports that he uses illicit drugs (Marijuana).  Skin: warm dry intact  Patient/Family orientated to room. Information packet given to patient/family. Admission inpatient armband information verified with patient/family to include name and date of birth and placed on patient arm. Side rails up x 2, fall assessment and education completed with patient/family. Patient/family able to verbalize understanding of risk associated with falls and verbalized understanding to call for assistance before getting out of bed. Call light within reach. Patient/family able to voice and demonstrate understanding of unit orientation instructions.    Will continue to evaluate and treat per MD orders.

## 2014-08-08 NOTE — H&P (Signed)
Date: 08/08/2014               Patient Name:  Connor Weber MRN: [id number]  DOB: [birth year] Age / Sex: 24 y.o., male   PCP: No Pcp Per Patient              Medical Service: Internal Medicine Teaching Service              Attending Physician: Dr. Bartholomew Crews, MD    First Contact: Dr. Trudee Kuster Pager: [phone]  Second Contact: Dr. Naaman Plummer Pager: [phone]            After Hours (After 5p/  First Contact Pager: [phone]  weekends / holidays): Second Contact Pager: [phone]   Chief Complaint:  Abdominal pain  History of Present Illness: Connor Weber is a 24 year old man with history of bipolar disorder, ADHD, and tobacco and marijuana abuse presenting with abdominal pain. He reports waking up from sleep with abdominal pain at 7 AM this morning. He describes the abdominal pain is sharp and periumbilical. It was not associated with food, but he reports eating a [employer] party last night. He says that he doesn't typically drink much water and eats many frozen meals. He did not initially feel nauseous, but on arrival to the ER he had an episode of vomiting. He's continued to have cramping abdominal pain which is slowly resolved. He currently only has mild pain. He denies any diarrhea, constipation, dysuria, back pain, hematochezia, or melena. He does say that his urine is dark, but he doesn't drink much water. He denies any sick contacts. He thinks he may be lactose intolerant, because he gets cramping when he drinks milk. He had a similar admission in September 2014 with nausea, vomiting, and abdominal pain. During that admission, he was noted to have a small volume of ascites. He was initially treated with IV Zosyn with resolution of his pain and discharged home on Augmentin for a total of 6 days of antibiotics. He was noted to have asymptomatic bradycardia during admission with heart rates in the low 40s while laying in bed.  In the ER, abdominal CT showed a small amount of free fluid in  the pelvis with possible colonic inflammation. He was given normal saline and started on Cipro and Flagyl. He was also given famotidine 20 mg IV and dicyclomine 20 mg IM.  Review of Systems: Review of Systems  Constitutional: Negative for fever and weight loss.  HENT: Negative for congestion and sore throat.   Eyes: Negative for blurred vision.  Respiratory: Negative for cough, sputum production and shortness of breath.   Cardiovascular: Negative for chest pain, palpitations and leg swelling.  Gastrointestinal: Positive for nausea, vomiting and abdominal pain. Negative for diarrhea and constipation.  Genitourinary: Negative for dysuria and frequency.  Musculoskeletal: Negative for myalgias, back pain and joint pain.  Skin: Negative for rash.  Neurological: Negative for dizziness, sensory change, focal weakness, weakness and headaches.   Meds: Medications Prior to Admission  Medication Sig Dispense Refill  . [DISCONTINUED] cephALEXin (KEFLEX) 500 MG capsule Take 1 capsule (500 mg total) by mouth 4 (four) times daily. (Patient not taking: Reported on 08/08/2014) 20 capsule 0   Current Facility-Administered Medications  Medication Dose Route Frequency Provider Last Rate Last Dose  . 0.9 %  sodium chloride infusion   Intravenous Continuous Francine Graven, DO 125 mL/hr at 08/08/14 1026    . acetaminophen (TYLENOL) tablet 650 mg  650 mg Oral Q6H PRN Marjan Rabbani,  MD       Or  . acetaminophen (TYLENOL) suppository 650 mg  650 mg Rectal Q6H PRN Marjan Rabbani, MD      . enoxaparin (LOVENOX) injection 40 mg  40 mg Subcutaneous Q24H Marjan Rabbani, MD      . folic acid (FOLVITE) tablet 1 mg  1 mg Oral Daily Marjan Rabbani, MD      . HYDROmorphone (DILAUDID) injection 1 mg  1 mg Intravenous Q4H PRN Francine Graven, DO      . LORazepam (ATIVAN) tablet 1 mg  1 mg Oral Q6H PRN Juluis Mire, MD       Or  . LORazepam (ATIVAN) injection 1 mg  1 mg Intravenous Q6H PRN Juluis Mire, MD      .  multivitamin with minerals tablet 1 tablet  1 tablet Oral Daily Marjan Rabbani, MD      . ondansetron (ZOFRAN) injection 4 mg  4 mg Intravenous Q1H PRN Francine Graven, DO   4 mg at 08/08/14 1129  . ondansetron (ZOFRAN) injection 4 mg  4 mg Intravenous Q8H PRN Francine Graven, DO      . pantoprazole (PROTONIX) EC tablet 40 mg  40 mg Oral Daily Marjan Rabbani, MD      . sodium chloride 0.9 % injection 3 mL  3 mL Intravenous Q12H Marjan Rabbani, MD      . thiamine (VITAMIN B-1) tablet 100 mg  100 mg Oral Daily Marjan Rabbani, MD       Or  . thiamine (B-1) injection 100 mg  100 mg Intravenous Daily Juluis Mire, MD        Allergies: Allergies as of 08/08/2014  . (No Known Allergies)   Past Medical History  Diagnosis Date  . Bipolar 1 disorder   . ADHD (attention deficit hyperactivity disorder)   . Manic, depressive    Past Surgical History  Procedure Laterality Date  . Hand surgery      left   History reviewed. No pertinent family history. History   Social History  . Marital Status: Single    Spouse Name: N/A  . Number of Children: N/A  . Years of Education: N/A   Occupational History  . Not on file.   Social History Main Topics  . Smoking status: Current Every Day Smoker  . Smokeless tobacco: Not on file  . Alcohol Use: Yes  . Drug Use: Yes    Special: Marijuana  . Sexual Activity: Not on file   Other Topics Concern  . Not on file   Social History Narrative    Physical Exam: Filed Vitals:   08/08/14 1844  BP: 110/75  Pulse: 42  Temp: 97.7 F (36.5 C)  Resp: 16   Physical Exam  Constitutional: He is oriented to person, place, and time and well-developed, well-nourished, and in no distress. No distress.  HENT:  Head: Normocephalic and atraumatic.  Mouth/Throat: No oropharyngeal exudate.  Eyes: Conjunctivae and EOM are normal. Pupils are equal, round, and reactive to light.  Neck: Normal range of motion. Neck supple.  Cardiovascular: Normal rate,  regular rhythm and normal heart sounds.   Pulmonary/Chest: Effort normal and breath sounds normal. No respiratory distress. He has no wheezes.  Abdominal: Soft. Bowel sounds are normal. He exhibits no distension. There is no tenderness.  Musculoskeletal: Normal range of motion. He exhibits no edema or tenderness.  Neurological: He is alert and oriented to person, place, and time. No cranial nerve deficit. He exhibits normal muscle tone.  Skin:  Skin is warm and dry. He is not diaphoretic.    Lab results: Basic Metabolic Panel:  Recent Labs  08/08/14 0940  NA 138  K 4.4  CL 106  CO2 24  GLUCOSE 70  BUN 14  CREATININE 1.07  CALCIUM 9.5   Liver Function Tests:  Recent Labs  08/08/14 0940  AST 39*  ALT 25  ALKPHOS 70  BILITOT 0.6  PROT 7.0  ALBUMIN 4.4    Recent Labs  08/08/14 0940  LIPASE 29   CBC:  Recent Labs  08/08/14 0940  WBC 4.5  NEUTROABS 2.2  HGB 13.6  HCT 41.1  MCV 87.1  PLT 153   Urine Drug Screen: Drugs of Abuse     Component Value Date/Time   LABOPIA NONE DETECTED 03/24/2014 1751   COCAINSCRNUR NONE DETECTED 03/24/2014 1751   LABBENZ NONE DETECTED 03/24/2014 1751   AMPHETMU NONE DETECTED 03/24/2014 1751   THCU POSITIVE* 03/24/2014 1751   LABBARB NONE DETECTED 03/24/2014 1751    Imaging results:  Ct Abdomen Pelvis W Contrast  08/08/2014   CLINICAL DATA:  24 year old with lower abdominal pain.  EXAM: CT ABDOMEN AND PELVIS WITH CONTRAST  TECHNIQUE: Multidetector CT imaging of the abdomen and pelvis was performed using the standard protocol following bolus administration of intravenous contrast.  CONTRAST:  84m OMNIPAQUE IOHEXOL 300 MG/ML  SOLN  COMPARISON:  CT 03/18/2013  FINDINGS: Small peripheral density in the right lower lobe is likely postinflammatory and an incidental finding. Negative for free air.  Images were obtained during the arterial phase of contrast. As a result, evaluation of the solid organs is somewhat limited. There is a small  focus of enhancement along the anterior right hepatic lobe which is likely an incidental finding. No gross abnormality to the liver and portal venous system appears to be patent. No gross abnormality to the spleen or pancreas or adrenal glands. Normal appearance of the right kidney. 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 No gross abnormality to the gallbladder.  Again noted is a small volume of fluid in the pelvis. Mild distention of the urinary bladder. There may be mild stranding around the splenic flexure of the colon and this is similar to the previous examination. There is contrast in the small and large bowel. There is contrast throughout the appendix. No evidence for appendix inflammation. Normal appearance of the small bowel. No significant lymphadenopathy.  No acute bone abnormality.  IMPRESSION: Small amount of free fluid in the pelvis. Mild stranding in the left upper abdomen near the splenic flexure. A small focus of colonic inflammation cannot be excluded.  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   Electronically Signed   By: AMarkus DaftM.D.   On: 08/08/2014 13:03   Assessment & Plan by Problem: Principal Problem:   Abdominal pain Active Problems:   Bradycardia   Colitis   Bipolar disorder   Tobacco abuse   Alcohol use   Marijuana abuse   Pelvic ascites   #Abdominal pain Most likely a viral gastroenteritis, with no diarrhea or changes in his bowel movements. No white blood cell count and afebrile. Also, could be related to lactose intolerance. He is currently feeling better. Given the fact that this is his second admission we'll rule out more serious underlying etiologies. He says that he's ready to eat. -Continue IV Cipro and Flagyl given evidence of colitis on CT. -Protonix 40 mg oral daily. -Tylenol 650 mg every 6 hours as needed for pain. -500 mL  normal saline bolus, then stop IV fluids. -Zofran every 8 hours as needed. -Check HIV, hepatitis  panel, ESR. -Check lipid panel, magnesium, phosphorus. -Check lactic acid. -Regular diet.  #Asymptomatic bradycardia Denies any symptoms related to his bradycardia. This was noted during his previous hospitalization. -Monitor on telemetry. -Check EKG. -No further workup unless symptomatic. -Check TSH.  #Bipolar disorder Not currently on any medications. -Consult social work for psych follow-up.  #Substance abuse Significant cigarette and marijuana history. Reports drinking liquor multiple times per week. Last drink last night. -Consult social work for smoking cessation. -CIWA protocol. -Check ethanol level.  #DVT prophylaxis -Lovenox.  Dispo: Disposition is deferred at this time, awaiting improvement of current medical problems. Anticipated discharge in approximately 1-2 day(s).   The patient does not have a current PCP (No Pcp Per Patient), therefore will be require OPC follow-up after discharge.   The patient does not have transportation limitations that hinder transportation to clinic appointments.   Signed:  Arman Filter, MD, PhD PGY-1 Internal Medicine Teaching Service Pager: [phone] 08/08/2014, 7:45 PM

## 2014-08-08 NOTE — ED Notes (Signed)
Admitting MD at the bedside.  

## 2014-08-09 DIAGNOSIS — K529 Noninfective gastroenteritis and colitis, unspecified: Secondary | ICD-10-CM | POA: Diagnosis not present

## 2014-08-09 DIAGNOSIS — F909 Attention-deficit hyperactivity disorder, unspecified type: Secondary | ICD-10-CM | POA: Diagnosis present

## 2014-08-09 DIAGNOSIS — E739 Lactose intolerance, unspecified: Secondary | ICD-10-CM | POA: Diagnosis present

## 2014-08-09 DIAGNOSIS — F1910 Other psychoactive substance abuse, uncomplicated: Secondary | ICD-10-CM | POA: Diagnosis present

## 2014-08-09 DIAGNOSIS — F17210 Nicotine dependence, cigarettes, uncomplicated: Secondary | ICD-10-CM | POA: Diagnosis present

## 2014-08-09 DIAGNOSIS — R109 Unspecified abdominal pain: Secondary | ICD-10-CM | POA: Diagnosis not present

## 2014-08-09 DIAGNOSIS — F1210 Cannabis abuse, uncomplicated: Secondary | ICD-10-CM | POA: Diagnosis present

## 2014-08-09 DIAGNOSIS — F319 Bipolar disorder, unspecified: Secondary | ICD-10-CM | POA: Diagnosis present

## 2014-08-09 DIAGNOSIS — R001 Bradycardia, unspecified: Secondary | ICD-10-CM | POA: Diagnosis present

## 2014-08-09 LAB — RAPID URINE DRUG SCREEN, HOSP PERFORMED
AMPHETAMINES: NOT DETECTED
Barbiturates: NOT DETECTED
Benzodiazepines: NOT DETECTED
Cocaine: NOT DETECTED
Opiates: NOT DETECTED
Tetrahydrocannabinol: POSITIVE — AB

## 2014-08-09 LAB — COMPREHENSIVE METABOLIC PANEL
ALT: 26 U/L (ref 0–53)
ANION GAP: 8 (ref 5–15)
AST: 36 U/L (ref 0–37)
Albumin: 3.8 g/dL (ref 3.5–5.2)
Alkaline Phosphatase: 62 U/L (ref 39–117)
BUN: 13 mg/dL (ref 6–23)
CO2: 24 mmol/L (ref 19–32)
Calcium: 8.8 mg/dL (ref 8.4–10.5)
Chloride: 104 mmol/L (ref 96–112)
Creatinine, Ser: 1.12 mg/dL (ref 0.50–1.35)
GLUCOSE: 87 mg/dL (ref 70–99)
POTASSIUM: 4.2 mmol/L (ref 3.5–5.1)
Sodium: 136 mmol/L (ref 135–145)
Total Bilirubin: 0.9 mg/dL (ref 0.3–1.2)
Total Protein: 6.3 g/dL (ref 6.0–8.3)

## 2014-08-09 LAB — HEPATITIS PANEL, ACUTE
HCV AB: NEGATIVE
HEP A IGM: NONREACTIVE
Hep B C IgM: NONREACTIVE
Hepatitis B Surface Ag: NEGATIVE

## 2014-08-09 LAB — URINALYSIS, ROUTINE W REFLEX MICROSCOPIC
Bilirubin Urine: NEGATIVE
GLUCOSE, UA: NEGATIVE mg/dL
HGB URINE DIPSTICK: NEGATIVE
Ketones, ur: NEGATIVE mg/dL
Leukocytes, UA: NEGATIVE
NITRITE: NEGATIVE
Protein, ur: NEGATIVE mg/dL
Specific Gravity, Urine: 1.011 (ref 1.005–1.030)
Urobilinogen, UA: 0.2 mg/dL (ref 0.0–1.0)
pH: 6.5 (ref 5.0–8.0)

## 2014-08-09 LAB — CBC
HEMATOCRIT: 38.5 % — AB (ref 39.0–52.0)
Hemoglobin: 12.3 g/dL — ABNORMAL LOW (ref 13.0–17.0)
MCH: 28.1 pg (ref 26.0–34.0)
MCHC: 31.9 g/dL (ref 30.0–36.0)
MCV: 87.9 fL (ref 78.0–100.0)
PLATELETS: 196 10*3/uL (ref 150–400)
RBC: 4.38 MIL/uL (ref 4.22–5.81)
RDW: 13.5 % (ref 11.5–15.5)
WBC: 5 10*3/uL (ref 4.0–10.5)

## 2014-08-09 LAB — SEDIMENTATION RATE: Sed Rate: 1 mm/hr (ref 0–16)

## 2014-08-09 LAB — MRSA PCR SCREENING: MRSA by PCR: NEGATIVE

## 2014-08-09 NOTE — Discharge Summary (Signed)
Name: Connor Weber MRN: [id number] DOB: 14-Nov-1990 24 y.o. PCP: [facility]  Date of Admission: 08/08/2014  9:17 AM Date of Discharge: 08/09/2014 Attending Physician: Burns Spain, MD  Discharge Diagnosis:  Principal Problem:   Abdominal pain Active Problems:   Bradycardia   Colitis   Bipolar disorder   Tobacco abuse   Alcohol use   Marijuana abuse   Pelvic ascites   Lactose intolerance  Discharge Medications:   Medication List    Notice    You have not been prescribed any medications.      Disposition and follow-up:   Connor Weber was discharged from [facility] in Good condition.  At the hospital follow up visit please address:  1.  Resolution of abdominal pain, following lactose-free diet.  2.  Labs / imaging needed at time of follow-up: None.  3.  Pending labs/ test needing follow-up: HIV antibody.  Follow-up Appointments:     Follow-up Information    Follow up with [facility]. Call in 1 week.   Specialty:  Internal Medicine   Contact information:   [address] [place] [address] [phone]       Discharge Instructions:  Thank you for allowing [place] to be involved in your healthcare while you were hospitalized at [facility].   Please note that there have not been changes to your home medications.  --> PLEASE LOOK AT YOUR DISCHARGE MEDICATION LIST FOR DETAILS.   Please call your PCP if you have any questions or concerns, or any difficulty getting any of your medications.  Please return to the ER if you have worsening of your symptoms or new severe symptoms arise.  Your symptoms are most likely due to lactose intolerance.  You should try to avoid dairy products to see if this improves your symptoms.  Below are some instructions to follow to eat a lactose-free diet.  You should also try to cut back on your marijuana use as this can cause abdominal pain and vomiting.  Make sure to  follow up with your primary care doctor. Discharge Instructions    Call MD for:  persistant nausea and vomiting    Complete by:  As directed      Call MD for:  redness, tenderness, or signs of infection (pain, swelling, redness, odor or green/yellow discharge around incision site)    Complete by:  As directed      Call MD for:  severe uncontrolled pain    Complete by:  As directed      Call MD for:  temperature >100.4    Complete by:  As directed      Diet - low sodium heart healthy    Complete by:  As directed      Increase activity slowly    Complete by:  As directed            Consultations: None.  Procedures Performed:  Ct Abdomen Pelvis W Contrast  08/08/2014   CLINICAL DATA:  24 year old with lower abdominal pain.  EXAM: CT ABDOMEN AND PELVIS WITH CONTRAST  TECHNIQUE: Multidetector CT imaging of the abdomen and pelvis was performed using the standard protocol following bolus administration of intravenous contrast.  CONTRAST:  80mL OMNIPAQUE IOHEXOL 300 MG/ML  SOLN  COMPARISON:  CT 03/18/2013  FINDINGS: Small peripheral density in the right lower lobe is likely postinflammatory and an incidental finding. Negative for free air.  Images were obtained during the arterial phase of contrast.  As a result, evaluation of the solid organs is somewhat limited. There is a small focus of enhancement along the anterior right hepatic lobe which is likely an incidental finding. No gross abnormality to the liver and portal venous system appears to be patent. No gross abnormality to the spleen or pancreas or adrenal glands. Normal appearance of the right kidney. 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 No gross abnormality to the gallbladder.  Again noted is a small volume of fluid in the pelvis. Mild distention of the urinary bladder. There may be mild stranding around the splenic flexure of the colon and this is similar to the previous examination. There is contrast in the small  and large bowel. There is contrast throughout the appendix. No evidence for appendix inflammation. Normal appearance of the small bowel. No significant lymphadenopathy.  No acute bone abnormality.  IMPRESSION: Small amount of free fluid in the pelvis. Mild stranding in the left upper abdomen near the splenic flexure. A small focus of colonic inflammation cannot be excluded.  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   Electronically Signed   By: Richarda Overlie M.D.   On: 08/08/2014 13:03   Admission HPI:  Connor Weber is a 24 year old man with history of bipolar disorder, ADHD, and tobacco and marijuana abuse presenting with abdominal pain. He reports waking up from sleep with abdominal pain at 7 AM this morning. He describes the abdominal pain is sharp and periumbilical. It was not associated with food, but he reports eating a [employer] party last night. He says that he doesn't typically drink much water and eats many frozen meals. He did not initially feel nauseous, but on arrival to the ER he had an episode of vomiting. He's continued to have cramping abdominal pain which is slowly resolved. He currently only has mild pain. He denies any diarrhea, constipation, dysuria, back pain, hematochezia, or melena. He does say that his urine is dark, but he doesn't drink much water. He denies any sick contacts. He thinks he may be lactose intolerant, because he gets cramping when he drinks milk. He had a similar admission in September 2014 with nausea, vomiting, and abdominal pain. During that admission, he was noted to have a small volume of ascites. He was initially treated with IV Zosyn with resolution of his pain and discharged home on Augmentin for a total of 6 days of antibiotics. He was noted to have asymptomatic bradycardia during admission with heart rates in the low 40s while laying in bed.  In the ER, abdominal CT showed a small amount of free fluid in the pelvis with possible colonic inflammation. He was  given normal saline and started on Cipro and Flagyl. He was also given famotidine 20 mg IV and dicyclomine 20 mg IM.  Hospital Course by problem list: Principal Problem:   Abdominal pain Active Problems:   Bradycardia   Colitis   Bipolar disorder   Tobacco abuse   Alcohol use   Marijuana abuse   Pelvic ascites   Lactose intolerance   #Abdominal pain He presented with his second episode of abdominal pain in the last two years.  He described waking from his sleep with sharp abdominal pain and having an episode of vomiting in the ER, and CT scan showed a small amount of free fluid in his pelvis along with findings suggestive of colonic inflammation.  He was initially started on IV Cipro and Flagyl.  Upon additional questioning, he reported recurrent abdominal cramping after  eating dairy products, and he reported eating a pizza prior to presentation.  It was thought that his abdominal symptoms were most likely due to lactose intolerance given his lack of diarrhea or laboratory abnormalities.  His abdominal pain had resolved on the morning after admission, and he was discharged without antibiotics and asked to follow up with his PCP at [facility].  He was advised to avoid lactose-containing foods and given instructions for how to follow a lactose-free diet.  #Asymptomatic bradycardia He was noted to have a resting heart rate in the low 40s, which was noted during his previous hospitalization. He was monitored on telemetry, but denied any symptoms related to his bradycardia.  #Bipolar disorder He reported being off of his bipolar medications for over a year, and appeared appropriate during the hospitalization.  He has not followed up with psychiatry in some time, so we recommend a referral at follow-up.  #Substance abuse He uses a significant amount of marijuana and tobacco regularly.  He was told that marijuana could be contributing to his GI symptoms and advised to refrain from smoking in the  future.  Discharge Vitals:   BP 118/66 mmHg  Pulse 49  Temp(Src) 98.4 F (36.9 C) (Oral)  Resp 16  Ht 5\' 7"  (1.702 m)  Wt 130 lb 12.8 oz (59.33 kg)  BMI 20.48 kg/m2  SpO2 100%  Discharge Labs:  Results for orders placed or performed during the hospital encounter of 08/08/14 (from the past 24 hour(s))  Hepatitis panel, acute     Status: None   Collection Time: 08/08/14  8:58 PM  Result Value Ref Range   Hepatitis B Surface Ag NEGATIVE NEGATIVE   HCV Ab NEGATIVE NEGATIVE   Hep A IgM NON REACTIVE NON REACTIVE   Hep B C IgM NON REACTIVE NON REACTIVE  Magnesium     Status: None   Collection Time: 08/08/14  8:58 PM  Result Value Ref Range   Magnesium 1.8 1.5 - 2.5 mg/dL  Phosphorus     Status: None   Collection Time: 08/08/14  8:58 PM  Result Value Ref Range   Phosphorus 3.2 2.3 - 4.6 mg/dL  Lactic acid, plasma     Status: None   Collection Time: 08/08/14  8:58 PM  Result Value Ref Range   Lactic Acid, Venous 0.9 0.5 - 2.0 mmol/L  TSH     Status: None   Collection Time: 08/08/14  8:58 PM  Result Value Ref Range   TSH 1.197 0.350 - 4.500 uIU/mL  Lipid panel     Status: None   Collection Time: 08/08/14  8:58 PM  Result Value Ref Range   Cholesterol 84 0 - 200 mg/dL   Triglycerides 19 <696<150 mg/dL   HDL 45 >29>39 mg/dL   Total CHOL/HDL Ratio 1.9 RATIO   VLDL 4 0 - 40 mg/dL   LDL Cholesterol 35 0 - 99 mg/dL  Sedimentation rate     Status: None   Collection Time: 08/08/14  8:58 PM  Result Value Ref Range   Sed Rate 1 0 - 16 mm/hr  Protime-INR     Status: Abnormal   Collection Time: 08/08/14  8:58 PM  Result Value Ref Range   Prothrombin Time 15.3 (H) 11.6 - 15.2 seconds   INR 1.20 0.00 - 1.49  APTT     Status: Abnormal   Collection Time: 08/08/14  8:58 PM  Result Value Ref Range   aPTT 41 (H) 24 - 37 seconds  Ethanol  Status: None   Collection Time: 08/08/14  8:58 PM  Result Value Ref Range   Alcohol, Ethyl (B) <5 0 - 9 mg/dL  Comprehensive metabolic panel      Status: None   Collection Time: 08/09/14  5:23 AM  Result Value Ref Range   Sodium 136 135 - 145 mmol/L   Potassium 4.2 3.5 - 5.1 mmol/L   Chloride 104 96 - 112 mmol/L   CO2 24 19 - 32 mmol/L   Glucose, Bld 87 70 - 99 mg/dL   BUN 13 6 - 23 mg/dL   Creatinine, Ser 1.61 0.50 - 1.35 mg/dL   Calcium 8.8 8.4 - 09.6 mg/dL   Total Protein 6.3 6.0 - 8.3 g/dL   Albumin 3.8 3.5 - 5.2 g/dL   AST 36 0 - 37 U/L   ALT 26 0 - 53 U/L   Alkaline Phosphatase 62 39 - 117 U/L   Total Bilirubin 0.9 0.3 - 1.2 mg/dL   GFR calc non Af Amer >90 >90 mL/min   GFR calc Af Amer >90 >90 mL/min   Anion gap 8 5 - 15  CBC     Status: Abnormal   Collection Time: 08/09/14  5:23 AM  Result Value Ref Range   WBC 5.0 4.0 - 10.5 K/uL   RBC 4.38 4.22 - 5.81 MIL/uL   Hemoglobin 12.3 (L) 13.0 - 17.0 g/dL   HCT 04.5 (L) 40.9 - 81.1 %   MCV 87.9 78.0 - 100.0 fL   MCH 28.1 26.0 - 34.0 pg   MCHC 31.9 30.0 - 36.0 g/dL   RDW 91.4 78.2 - 95.6 %   Platelets 196 150 - 400 K/uL  MRSA PCR Screening     Status: None   Collection Time: 08/09/14  7:48 AM  Result Value Ref Range   MRSA by PCR NEGATIVE NEGATIVE  Urine rapid drug screen (hosp performed)     Status: Abnormal   Collection Time: 08/09/14 12:59 PM  Result Value Ref Range   Opiates NONE DETECTED NONE DETECTED   Cocaine NONE DETECTED NONE DETECTED   Benzodiazepines NONE DETECTED NONE DETECTED   Amphetamines NONE DETECTED NONE DETECTED   Tetrahydrocannabinol POSITIVE (A) NONE DETECTED   Barbiturates NONE DETECTED NONE DETECTED  Urinalysis, Routine w reflex microscopic     Status: None   Collection Time: 08/09/14 12:59 PM  Result Value Ref Range   Color, Urine YELLOW YELLOW   APPearance CLEAR CLEAR   Specific Gravity, Urine 1.011 1.005 - 1.030   pH 6.5 5.0 - 8.0   Glucose, UA NEGATIVE NEGATIVE mg/dL   Hgb urine dipstick NEGATIVE NEGATIVE   Bilirubin Urine NEGATIVE NEGATIVE   Ketones, ur NEGATIVE NEGATIVE mg/dL   Protein, ur NEGATIVE NEGATIVE mg/dL    Urobilinogen, UA 0.2 0.0 - 1.0 mg/dL   Nitrite NEGATIVE NEGATIVE   Leukocytes, UA NEGATIVE NEGATIVE    Signed: Luisa Dago, MD 08/09/2014, 2:02 PM    Services Ordered on Discharge: None. Equipment Ordered on Discharge: None.

## 2014-08-09 NOTE — Progress Notes (Signed)
Connor DupesErik R Weber discharged Home per MD order.  Discharge instructions reviewed and discussed with the patient, all questions and concerns answered. Copy of instructions and care notes for diagnosis given to patient.    Medication List    Notice    You have not been prescribed any medications.      Patients skin is clean, dry and intact, no evidence of skin break down. IV site discontinued and catheter remains intact. Site without signs and symptoms of complications. Dressing and pressure applied.  Patient escorted to car by NT ambulating, no distress noted upon discharge.  Connor BenesJohnson, Connor Weber 08/09/2014 3:31 PM

## 2014-08-09 NOTE — Progress Notes (Signed)
Subjective:    Currently, the patient reports feeling better with resolution of his abdominal pain and nausea. He has no complaints currently.  Interval Events: -Continued on Cipro and Flagyl. -Given 500 mL IV bolus.    Objective:    Vital Signs:   Temp:  [97.7 F (36.5 C)-98.4 F (36.9 C)] 98.4 F (36.9 C) (02/13 1223) Pulse Rate:  [37-83] 49 (02/13 1223) Resp:  [9-30] 16 (02/13 1223) BP: (104-135)/(47-93) 118/66 mmHg (02/13 1223) SpO2:  [95 %-100 %] 100 % (02/13 1223) Weight:  [130 lb 12.8 oz (59.33 kg)] 130 lb 12.8 oz (59.33 kg) (02/12 1844) Last BM Date: 08/07/14  24-hour weight change: Weight change:   Intake/Output:   Intake/Output Summary (Last 24 hours) at 08/09/14 1246 Last data filed at 08/09/14 1054  Gross per 24 hour  Intake      0 ml  Output   1100 ml  Net  -1100 ml      Physical Exam: General: Vital signs reviewed and noted. Well-developed, well-nourished, in no acute distress; alert, appropriate and cooperative throughout examination.  Lungs:  Normal respiratory effort. Clear to auscultation BL without crackles or wheezes.  Heart: RRR. S1 and S2 normal without gallop, murmur, or rubs.  Abdomen:  BS normoactive. Soft, Nondistended, non-tender.  No masses or organomegaly.  Extremities: No pretibial edema.     Labs:  Basic Metabolic Panel:  Recent Labs Lab 08/08/14 0940 08/08/14 2058 08/09/14 0523  NA 138  --  136  K 4.4  --  4.2  CL 106  --  104  CO2 24  --  24  GLUCOSE 70  --  87  BUN 14  --  13  CREATININE 1.07  --  1.12  CALCIUM 9.5  --  8.8  MG  --  1.8  --   PHOS  --  3.2  --     Liver Function Tests:  Recent Labs Lab 08/08/14 0940 08/09/14 0523  AST 39* 36  ALT 25 26  ALKPHOS 70 62  BILITOT 0.6 0.9  PROT 7.0 6.3  ALBUMIN 4.4 3.8    Recent Labs Lab 08/08/14 0940  LIPASE 29   CBC:  Recent Labs Lab 08/08/14 0940 08/09/14 0523  WBC 4.5 5.0  NEUTROABS 2.2  --   HGB 13.6 12.3*  HCT 41.1 38.5*  MCV 87.1  87.9  PLT 153 196    Microbiology: Results for orders placed or performed during the hospital encounter of 03/18/13  Urine culture     Status: None   Collection Time: 03/18/13  9:24 AM  Result Value Ref Range Status   Specimen Description URINE, CLEAN CATCH  Final   Special Requests NONE  Final   Culture  Setup Time   Final    03/18/2013 17:22 Performed at [place] Performed at [facility]  Final   Culture NO GROWTH Performed at [facility]  Final   Report Status 03/19/2013 FINAL  Final    Coagulation Studies:  Recent Labs  08/08/14 2058  LABPROT 15.3*  INR 1.20    Imaging: Ct Abdomen Pelvis W Contrast  08/08/2014   CLINICAL DATA:  24 year old with lower abdominal pain.  EXAM: CT ABDOMEN AND PELVIS WITH CONTRAST  TECHNIQUE: Multidetector CT imaging of the abdomen and pelvis was performed using the standard protocol following bolus administration of intravenous contrast.  CONTRAST:  68m OMNIPAQUE IOHEXOL 300 MG/ML  SOLN  COMPARISON:  CT 03/18/2013  FINDINGS: Small peripheral density in the right lower lobe is likely postinflammatory and an incidental finding. Negative for free air.  Images were obtained during the arterial phase of contrast. As a result, evaluation of the solid organs is somewhat limited. There is a small focus of enhancement along the anterior right hepatic lobe which is likely an incidental finding. No gross abnormality to the liver and portal venous system appears to be patent. No gross abnormality to the spleen or pancreas or adrenal glands. Normal appearance of the right kidney. 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 No gross abnormality to the gallbladder.  Again noted is a small volume of fluid in the pelvis. Mild distention of the urinary bladder. There may be mild stranding around the splenic flexure of the colon and this is similar to the previous examination. There is contrast  in the small and large bowel. There is contrast throughout the appendix. No evidence for appendix inflammation. Normal appearance of the small bowel. No significant lymphadenopathy.  No acute bone abnormality.  IMPRESSION: Small amount of free fluid in the pelvis. Mild stranding in the left upper abdomen near the splenic flexure. A small focus of colonic inflammation cannot be excluded.  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   Electronically Signed   By: Markus Daft M.D.   On: 08/08/2014 13:03       Medications:    Infusions:    Scheduled Medications: . enoxaparin (LOVENOX) injection  40 mg Subcutaneous Q24H  . folic acid  1 mg Oral Daily  . multivitamin with minerals  1 tablet Oral Daily  . pantoprazole  40 mg Oral Daily  . sodium chloride  3 mL Intravenous Q12H  . thiamine  100 mg Oral Daily   Or  . thiamine  100 mg Intravenous Daily    PRN Medications: acetaminophen **OR** acetaminophen, LORazepam **OR** LORazepam   Assessment/ Plan:    Principal Problem:   Abdominal pain Active Problems:   Bradycardia   Colitis   Bipolar disorder   Tobacco abuse   Alcohol use   Marijuana abuse   Pelvic ascites   Lactose intolerance  #Abdominal pain Most likely lactose intolerance given rapid resolution. Could also be a viral gastroenteritis with inflammation observed on CT. Vital signs stable overnight and tolerating a full diet. Lipid panel, TSH, lactic acid, ESR, hepatitis panel negative. -Stop antibiotics. -Advised patient to avoid lactose-containing foods. -Follow-up HIV antibody.  #Substance abuse After all negative. -Encouraged patient to stop smoking marijuana as this could be contributing to his vomiting.  #Bipolar disorder He follows with [facility]. -We'll encourage them to refer him to psychiatry for treatment of his bipolar.  #Asymptomatic bradycardia Continues to not have any symptoms. -No further workup necessary.   DVT PPX - low molecular  weight heparin  CODE STATUS - Full  CONSULTS PLACED - None.  DISPO - discharge home today.   The patient does have a current PCP (No PCP Per Patient) and does not need an [facility] hospital follow-up appointment after discharge.    Is the [facility] hospital follow-up appointment a one-time only appointment? Not applicable.  Does the patient have transportation limitations that hinder transportation to clinic appointments? yes   SERVICE NEEDED AT [place]         Y = Yes, Blank = No PT:   OT:   RN:   Equipment:   Other:      Length of Stay:  day(s)   Signed: Arman Filter, MD  PGY-1, Internal Medicine Resident Pager: [phone] (7AM-5PM) 08/09/2014, 12:46 PM

## 2014-08-09 NOTE — Progress Notes (Signed)
Utilization Review completed.  

## 2014-08-09 NOTE — Discharge Instructions (Signed)
Thank you for allowing [place] to be involved in your healthcare while you were hospitalized at [facility].   Please note that there have not been changes to your home medications.  --> PLEASE LOOK AT YOUR DISCHARGE MEDICATION LIST FOR DETAILS.   Please call your PCP if you have any questions or concerns, or any difficulty getting any of your medications.  Please return to the ER if you have worsening of your symptoms or new severe symptoms arise.  Your symptoms are most likely due to lactose intolerance.  You should try to avoid dairy products to see if this improves your symptoms.  Below are some instructions to follow to eat a lactose-free diet.  You should also try to cut back on your marijuana use as this can cause abdominal pain and vomiting.  Make sure to follow up with your primary care doctor.  Lactose-Free Diet Lactose is a carbohydrate that is found mainly in milk and milk products, as well as in foods with added milk or whey. Lactose must be digested by the enzyme lactase in order to be used by the body. Lactose intolerance occurs when there is a shortage of lactase. When your body is not able to digest lactose, you may feel sick to your stomach (nausea), bloated, and have cramps, gas, and diarrhea. TYPES OF LACTASE DEFICIENCY 3. Primary lactase deficiency. This is the most common type. It is characterized by a slow decrease in lactase activity. 4. Secondary lactase deficiency. This occurs following injury to the small intestinal mucosa as a result of a disease or condition. It can also occur as a result of surgery or after treatment with antibiotic medicines or cancer drugs. Tolerance to lactose varies widely. Each person must determine how much milk can be consumed without developing symptoms. Drinking smaller portions of milk throughout the day may be helpful. Some studies suggest that slowing gastric emptying may help increase tolerance of milk products. This may  be done by:  Consuming milk or milk products with a meal rather than alone.  Consuming milk with a higher fat content. There are many dairy products that may be tolerated better than milk by some people, including: 7. Cheese (especially aged cheese). The lactose content is much lower than in milk. 8. Cultured dairy products, such as yogurt, buttermilk, cottage cheese, and sweet acidophilus milk (kefir). These products are usually well tolerated by lactase-deficient people. This is because the healthy bacteria help digest lactose. 9. Lactose-hydrolyzed milk. This product contains 40% to 90% less lactose than milk and may also be well tolerated. ADEQUACY These diets may be deficient in calcium, riboflavin, and vitamin D, according to the Recommended Dietary Allowances of the [employer]. Depending on individual tolerances and the use of milk substitutes, milk, or other dairy products, you may be able to meet these recommendations. SPECIAL NOTES  Lactose is a carbohydrate. The main food source for lactose is dairy products. Reading food labels is important. Many products contain lactose even when they are not made from milk. Look for the following words: whey, milk solids, dry milk solids, nonfat dry milk powder. Typical sources of lactose other than dairy products include breads, candies, cold cuts, prepared and processed foods, and commercial sauces and gravies.  All foods must be prepared without milk, cream, or other dairy foods.  A vitamin or mineral supplement may be necessary. Consult your caregiver or Registered Dietitian.  Lactose is also found in many prescription and over-the-counter medicines.  Soy  milk and lactose-free supplements may be used as an alternative to milk. CHOOSING FOODS Breads and Starches  Allowed: Breads and rolls made without milk. [place], [place], or [place] bread. Soda crackers, graham crackers. Any crackers prepared without lactose. Cooked or dry  cereals prepared without lactose (read labels). Any potatoes, pasta, or rice prepared without milk or lactose. Popcorn.  Avoid: Breads and rolls that contain milk. Prepared mixes such as muffins, biscuits, waffles, pancakes. Sweet rolls, donuts, [place] toast (if made with milk or lactose). Zwieback crackers, corn curls, or any crackers that contain lactose. Cooked or dry cereals prepared with lactose (read labels). Instant potatoes, frozen [place] fries, scalloped or au gratin potatoes. Vegetables  Allowed: Fresh, frozen, and canned vegetables.  Avoid: Creamed or breaded vegetables. Vegetables in a cheese sauce or with lactose-containing margarines. Fruit  Allowed: All fresh, canned, or frozen fruits that are not processed with lactose.  Avoid: Any canned or frozen fruits processed with lactose. Meat and Meat Substitutes  Allowed: Plain beef, chicken, fish, [place], lamb, veal, pork, or ham. Kosher prepared meat products. Strained or junior meats that do not contain milk. Eggs, soy meat substitutes, nuts.  Avoid: Scrambled eggs, omelets, and souffles that contain milk. Creamed or breaded meat, fish, or fowl. Sausage products such as wieners, liver sausage, or cold cuts that contain milk solids. Cheese, cottage cheese, or cheese spreads. Milk  Allowed: None.  Avoid: Milk (whole, 2%, skim, or chocolate). Evaporated, powdered, or condensed milk. Malted milk. Soups and Combination Foods  Allowed: Bouillon, broth, vegetable soups, clear soups, consomms. Homemade soups made with allowed ingredients. Combination or prepared foods that do not contain milk or milk products (read labels).  Avoid: Cream soups, chowders, commercially prepared soups containing lactose. Macaroni and cheese, pizza. Combination or prepared foods that contain milk or milk products. Desserts and Sweets  Allowed: Water and fruit ices, gelatin, angel food cake. Homemade cookies, pies, or cakes made from allowed ingredients.  Pudding (if made with water or a milk substitute). Lactose-free tofu desserts. Sugar, honey, corn syrup, jam, jelly, marmalade, molasses (beet sugar). Pure sugar candy, marshmallows.  Avoid: Ice cream, ice milk, sherbet, custard, pudding, frozen yogurt. Commercial cake and cookie mixes. Desserts that contain chocolate. Pie crust made with milk-containing margarine. Reduced calorie desserts made with a sugar substitute that contains lactose. Toffee, peppermint, butterscotch, chocolate, caramels. Fats and Oils  Allowed: Butter (as tolerated, contains very small amounts of lactose). Margarines and dressings that do not contain milk. Vegetable oils, shortening, mayonnaise, nondairy cream and whipped toppings without lactose or milk solids added. Tomasa Blase.  Avoid: Margarines and salad dressings containing milk. Cream, cream cheese, peanut butter with added milk solids, sour cream, chip dips made with sour cream. Beverages  Allowed: Carbonated drinks, tea, coffee and freeze-dried coffee, some instant coffees (check labels). Fruit drinks, fruit and vegetable juice, rice or soy milk.  Avoid: Hot chocolate. Some cocoas, some instant coffees, instant iced teas, powdered fruit drinks (read labels). Condiments  Allowed: Soy sauce, carob powder, olives, gravy made with water, baker's cocoa, pickles, pure seasonings and spices, wine, pure monosodium glutamate, catsup, mustard.  Avoid: Some chewing gums, chocolate, some cocoas. Certain antibiotics and vitamin or mineral preparations. Spice blends if they contain milk products. MSG extender. Artificial sweeteners that contain lactose. Some nondairy creamers (read labels). SAMPLE MENU Breakfast  Orange juice.  Banana.  Bran cereal.  Nondairy creamer.  Vienna bread, toasted.  Butter or milk-free margarine.  Coffee or tea. Lunch  Chicken breast.  Rice.  Green beans.  Butter or milk-free margarine.  Fresh melon.  Coffee or  tea. Dinner  Boe[place]ast beef.  Baked potato.  Butter or milk-free margarine.  Broccoli.  Lettuce salad with vinegar and oil dressing.  [employer] food cake.  Coffee or tea. Document Released: 12/03/2001 Document Revised: 09/05/2011 Document Reviewed: 09/13/2013 [facility] Patient Information 2015 [place], [place]. This information is not intended to replace advice given to you by your health care provider. Make sure you discuss any questions you have with your health care provider.   Lactose Intolerance, Adult Lactose intolerance is when the body is not able to digest lactose, a sugar found in milk and milk products. Lactose intolerance is caused by your body not producing enough of the enzyme lactase. When there is not enough lactase to digest the amount of lactose consumed, discomfort may be felt. Lactose intolerance is not a milk allergy. For most people, lactase deficiency is a condition that develops naturally over time. After about the age of 2, the body begins to produce less lactase. But many people may not experience symptoms until they are much older. CAUSES Things that can cause you to be lactose intolerant include: 5. Aging. 6. Being born without the ability to make lactase. 7. Certain digestive diseases. 8. Injuries to the small intestine. SYMPTOMS   Feeling sick to your stomach (nauseous).  Diarrhea.  Cramps.  Bloating.  Gas. Symptoms usually show up a half hour or 2 hours after eating or drinking products containing lactose. TREATMENT  No treatment can improve the body's ability to produce lactase. However, symptoms can be controlled through diet. A medicine may be given to you to take when you consume lactose-containing foods or drinks. The medicine contains the lactase enzyme, which help the body digest lactose better. HOME CARE INSTRUCTIONS 10. Eat or drink dairy products as told by your caregiver or dietician. 11. Take all medicine as directed by your  caregiver. 12. Find lactose-free or lactose-reduced products at your local grocery store. 13. Talk to your caregiver or dietician to decide if you need any dietary supplements. The following is the amount of calcium needed from the diet:  19 to 50 years: 1000 mg  Over 50 years: 1200 mg Calcium and Lactose in Common Foods Non-Dairy Products / Calcium Content (mg)  Calcium-fortified orange juice, 1 cup / 308 to 344 mg  Sardines, with edible bones, 3 oz / 270 mg  Salmon, canned, with edible bones, 3 oz / 205 mg  Soymilk, fortified, 1 cup / 200 mg  Broccoli (raw), 1 cup / 90 mg  Orange, 1 medium / 50 mg  Pinto beans,  cup / 40 mg  Tuna, canned, 3 oz / 10 mg  Lettuce greens,  cup / 10 mg Dairy Products / Calcium Content (mg) / Lactose Content (g)  Yogurt, plain, low-fat, 1 cup / 415 mg / 5 g  Milk, reduced fat, 1 cup / 295 mg / 11 g  Swiss cheese, 1 oz / 270 mg / 1 g  Ice cream,  cup / 85 mg / 6 g  Cottage cheese,  cup / 75 mg / 2 to 3 g SEEK MEDICAL CARE IF: You have no relief from your symptoms. Document Released: 06/13/2005 Document Revised: 09/05/2011 Document Reviewed: 09/13/2013 [facility] Patient Information 2015 [place], [place]. This information is not intended to replace advice given to you by your health care provider. Make sure you discuss any questions you have with your health care provider.

## 2014-08-09 NOTE — Plan of Care (Signed)
Problem: Phase I Progression Outcomes Goal: Initial discharge plan identified Outcome: Completed/Met Date Met:  08/09/14 To return home     

## 2014-08-11 LAB — HIV ANTIBODY (ROUTINE TESTING W REFLEX): HIV SCREEN 4TH GENERATION: NONREACTIVE

## 2015-10-01 ENCOUNTER — Emergency Department (HOSPITAL_COMMUNITY)
Admission: EM | Admit: 2015-10-01 | Discharge: 2015-10-01 | Disposition: A | Discharge status: Home / Self Care | Payer: Medicaid Other | Attending: Emergency Medicine | Admitting: Emergency Medicine

## 2015-10-01 ENCOUNTER — Encounter (HOSPITAL_COMMUNITY): Payer: Self-pay | Admitting: Emergency Medicine

## 2015-10-01 DIAGNOSIS — R6884 Jaw pain: Secondary | ICD-10-CM | POA: Diagnosis not present

## 2015-10-01 DIAGNOSIS — F17210 Nicotine dependence, cigarettes, uncomplicated: Secondary | ICD-10-CM | POA: Insufficient documentation

## 2015-10-01 DIAGNOSIS — K0889 Other specified disorders of teeth and supporting structures: Secondary | ICD-10-CM | POA: Diagnosis present

## 2015-10-01 DIAGNOSIS — Z79899 Other long term (current) drug therapy: Secondary | ICD-10-CM | POA: Insufficient documentation

## 2015-10-01 MED ORDER — NAPROXEN 500 MG PO TABS: 500.0000 mg | ORAL_TABLET | Freq: Two times a day (BID) | ORAL | Status: DC

## 2015-10-01 MED ORDER — METHOCARBAMOL 500 MG PO TABS: 500.0000 mg | ORAL_TABLET | Freq: Two times a day (BID) | ORAL | Status: DC

## 2015-10-01 NOTE — ED Provider Notes (Signed)
CSN: [id number]     Arrival date & time 10/01/15  1203 History  By signing my name below, I, Ronney Lion, attest that this documentation has been prepared under the direction and in the presence of Fayrene Helper, PA-C. Electronically Signed: Ronney Lion, ED Scribe. 10/01/2015. 12:47 PM.    No chief complaint on file.  The history is provided by the patient. No language interpreter was used.    HPI Comments: Connor Weber is a 25 y.o. male who presents to the Emergency Department complaining of gradual-onset, constant, severe right lower dental pain radiating to his right-sided head that began yesterday and seemed to improve before worsening. He notes associated mild right lower jaw swelling. He states he had tried applying Neosporin with no relief. Talking and exposure to air exacerbates his pain. He denies any known fever. Denies any recent injury.    No past medical history on file. No family history on file. Social History  Substance Use Topics  . Smoking status: Current Every Day Smoker  . Smokeless tobacco: Not on file  . Alcohol Use: Yes    Review of Systems  Constitutional: Negative for fever.  HENT: Positive for dental problem and facial swelling.   Neurological: Positive for headaches.   Allergies  Review of patient's allergies indicates no known allergies.  Home Medications   Prior to Admission medications   Medication Sig Start Date End Date Taking? Authorizing Provider  lisdexamfetamine (VYVANSE) 30 MG capsule Take 30 mg by mouth daily as needed. When angry to calm down    Historical Provider, MD  PRESCRIPTION MEDICATION Take 1 tablet by mouth daily. For adhd and bipolar ( on 2 different meds)    Historical Provider, MD   There were no vitals taken for this visit. Physical Exam  Constitutional: He is oriented to person, place, and time. He appears well-developed and well-nourished. No distress.  HENT:  Head: Normocephalic and atraumatic.  Left Ear: Hearing, tympanic  membrane, external ear and ear canal normal.  Ears are normal. No significant dental tenderness on palpation.   Tenderness along R lower jaw on palpation, clicking sound noted when pt open jaw fully.  No malocclusion, no overlying skin changes  Eyes: Conjunctivae and EOM are normal.  Neck: Normal range of motion. Neck supple. No tracheal deviation present.  No lymphadenopathy.  Cardiovascular: Normal rate.   Pulmonary/Chest: Effort normal. No respiratory distress.  Musculoskeletal: Normal range of motion.  Lymphadenopathy:    He has no cervical adenopathy.  Neurological: He is alert and oriented to person, place, and time.  Skin: Skin is warm and dry.  Psychiatric: He has a normal mood and affect. His behavior is normal.  Nursing note and vitals reviewed.   ED Course  Procedures (including critical care time)  DIAGNOSTIC STUDIES: Oxygen Saturation is 100% on RA, normal by my interpretation.    COORDINATION OF CARE: 12:47 PM - Discussed treatment plan with pt at bedside which includes anti-inflammatory medications and muscle relaxants. Will give dental referral. Strict return precautions given. Pt verbalized understanding and agreed to plan.   MDM   Final diagnoses:  Pain in lower jaw   Patient with dentalgia.  Suspect TMJ causing pain.  No abscess requiring immediate incision and drainage.  Exam not concerning for Ludwig's angina or pharyngeal abscess. Pt instructed to follow-up with dentist if he's concern of dental pain.  Discussed return precautions. Pt safe for discharge.  BP 129/82 mmHg  Pulse 56  Temp(Src) 97.5 F (36.4 C) (  Oral)  Resp 16  SpO2 100%   I personally performed the services described in this documentation, which was scribed in my presence. The recorded information has been reviewed and is accurate.       Fayrene HelperBowie Tavia Stave, PA-C 10/01/15 1259  Benjiman CoreNathan Pickering, MD 10/02/15 [phone]

## 2015-10-01 NOTE — Discharge Instructions (Signed)

## 2015-10-01 NOTE — ED Notes (Signed)
C/o right lower dental pain x 3 days.

## 2015-12-24 ENCOUNTER — Encounter (HOSPITAL_COMMUNITY): Payer: Self-pay | Admitting: *Deleted

## 2015-12-24 ENCOUNTER — Emergency Department (HOSPITAL_COMMUNITY)
Admission: EM | Admit: 2015-12-24 | Discharge: 2015-12-24 | Disposition: A | Discharge status: Home / Self Care | Payer: Medicaid Other | Attending: Emergency Medicine | Admitting: Emergency Medicine

## 2015-12-24 DIAGNOSIS — R369 Urethral discharge, unspecified: Secondary | ICD-10-CM | POA: Diagnosis present

## 2015-12-24 DIAGNOSIS — R300 Dysuria: Secondary | ICD-10-CM | POA: Diagnosis not present

## 2015-12-24 DIAGNOSIS — F17210 Nicotine dependence, cigarettes, uncomplicated: Secondary | ICD-10-CM | POA: Insufficient documentation

## 2015-12-24 DIAGNOSIS — Z711 Person with feared health complaint in whom no diagnosis is made: Secondary | ICD-10-CM

## 2015-12-24 LAB — URINALYSIS, ROUTINE W REFLEX MICROSCOPIC
BILIRUBIN URINE: NEGATIVE
GLUCOSE, UA: NEGATIVE mg/dL
Ketones, ur: 15 mg/dL — AB
Nitrite: NEGATIVE
PH: 6 (ref 5.0–8.0)
Protein, ur: 100 mg/dL — AB
SPECIFIC GRAVITY, URINE: 1.032 — AB (ref 1.005–1.030)

## 2015-12-24 LAB — URINE MICROSCOPIC-ADD ON: Bacteria, UA: NONE SEEN

## 2015-12-24 MED ORDER — CEFTRIAXONE SODIUM 250 MG IJ SOLR
250.0000 mg | Freq: Once | INTRAMUSCULAR | Status: AC
  Administered 2015-12-24: 250 mg via INTRAMUSCULAR
  Filled 2015-12-24: qty 250

## 2015-12-24 MED ORDER — AZITHROMYCIN 250 MG PO TABS
1000.0000 mg | ORAL_TABLET | Freq: Once | ORAL | Status: AC
  Administered 2015-12-24: 1000 mg via ORAL
  Filled 2015-12-24: qty 4

## 2015-12-24 NOTE — ED Provider Notes (Signed)
CSN: [id number]     Arrival date & time 12/24/15  1656 History  By signing my name below, I, Placido SouLogan Joldersma, attest that this documentation has been prepared under the direction and in the presence of Everlene FarrierWilliam Stephane Junkins, PA-C. Electronically Signed: Placido SouLogan Joldersma, ED Scribe. 12/24/2015. 6:03 PM.   Chief Complaint  Patient presents with  . Penile Discharge   The history is provided by the patient. No language interpreter was used.   HPI Comments: Connor Weber is a 25 y.o. male who presents to the Emergency Department complaining of constant, mild, yellow penile discharge x 5 days. He reports associated dysuria. He states that he has been sexually active with a male partner and during the most recent encounter his condom broke. Pt reports a PMHx of STD. He has no known drug allergies. He denies fevers, chills, rashes, abd pain, n/v/d, oral lesions, penile/rectal lesions or rectal pain.   Past Medical History  Diagnosis Date  . Bipolar 1 disorder (HCC)   . ADHD (attention deficit hyperactivity disorder)   . Manic, depressive (HCC)   . Anxiety   . Depression    Past Surgical History  Procedure Laterality Date  . Hand tendon surgery Right ~ 03/2014   No family history on file. Social History  Substance Use Topics  . Smoking status: Current Every Day Smoker -- 1.00 packs/day for 10 years    Types: Cigarettes  . Smokeless tobacco: Never Used  . Alcohol Use: Yes     Comment: 08/10/2014 "maybe one saturday q month I'll have 4-5 shots"    Review of Systems  Constitutional: Negative for fever and chills.  HENT: Negative for mouth sores.   Gastrointestinal: Negative for nausea, vomiting, abdominal pain, diarrhea and rectal pain.  Genitourinary: Positive for dysuria and discharge. Negative for urgency, frequency, hematuria, decreased urine volume, penile swelling, scrotal swelling, difficulty urinating, genital sores, penile pain and testicular pain.  Skin: Negative for rash.    Allergies   Review of patient's allergies indicates no known allergies.  Home Medications   Prior to Admission medications   Not on File   BP 139/95 mmHg  Pulse 63  Temp(Src) 98.7 F (37.1 C) (Oral)  Resp 18  Ht 5\' 7"  (1.702 m)  Wt 55.792 kg  BMI 19.26 kg/m2  SpO2 100%    Physical Exam  Constitutional: He appears well-developed and well-nourished. No distress.  Nontoxic-appearing.  HENT:  Head: Normocephalic and atraumatic.  Mouth/Throat: Oropharynx is clear and moist.  Eyes: Conjunctivae are normal. Pupils are equal, round, and reactive to light. Right eye exhibits no discharge. Left eye exhibits no discharge.  Neck: Neck supple.  Cardiovascular: Normal rate, regular rhythm, normal heart sounds and intact distal pulses.   Pulmonary/Chest: Effort normal and breath sounds normal. No respiratory distress.  Abdominal: Soft. He exhibits no distension. There is no tenderness. There is no guarding.  Genitourinary: No penile tenderness.  Chaperone present White penile discharge noted; no penile or testicular tenderness; scrotal contents nml; no rashes  Lymphadenopathy:    He has no cervical adenopathy.  Neurological: He is alert. Coordination normal.  Skin: Skin is warm and dry. No rash noted. He is not diaphoretic. No erythema. No pallor.  Psychiatric: He has a normal mood and affect. His behavior is normal.  Nursing note and vitals reviewed.   ED Course  Procedures  DIAGNOSTIC STUDIES: Oxygen Saturation is 100% on RA, normal by my interpretation.    COORDINATION OF CARE: 6:03 PM Discussed next steps with  pt. Pt verbalized understanding and is agreeable with the plan.   Labs Review Labs Reviewed  URINALYSIS, ROUTINE W REFLEX MICROSCOPIC [facility])  GC/CHLAMYDIA PROBE AMP ([place]) NOT [facility]    Imaging Review No results found. I have personally reviewed and evaluated these lab results as part of my medical decision-making.   EKG Interpretation None      Filed  Vitals:   12/24/15 1702  BP: 139/95  Pulse: 63  Temp: 98.7 F (37.1 C)  TempSrc: Oral  Resp: 18  Height: 5\' 7"  (1.702 m)  Weight: 55.792 kg  SpO2: 100%     MDM   Meds given in ED:  Medications  cefTRIAXone (ROCEPHIN) injection 250 mg (not administered)  azithromycin (ZITHROMAX) tablet 1,000 mg (not administered)    New Prescriptions   No medications on file    Final diagnoses:  Concern about STD in male without diagnosis   This  is a 25 y.o. male who presents to the Emergency Department complaining of constant, mild, yellow penile discharge x 5 days. He reports associated dysuria. He states that he has been sexually active with a male partner and during the most recent encounter his condom broke. Pt reports a PMHx of STD. He has no known drug allergies. He denies fevers, chills, rashes, abd pain, n/v/d.  On exam patient is afebrile nontoxic appearing. He has white penile discharge on exam. No GU rashes or lesions. No penile or testicular tenderness. His abdomen is soft and nontender. Will treat for gonorrhea and chlamydia. Gonorrhea and Chlamydia testing pending. I encouraged him to follow-up next week with the health department for STD testing that includes HIV and syphilis as well. I discussed safe sex practices. I advised the patient to follow-up with their primary care provider this week. I advised the patient to return to the emergency department with new or worsening symptoms or new concerns. The patient verbalized understanding and agreement with plan.    I personally performed the services described in this documentation, which was scribed in my presence. The recorded information has been reviewed and is accurate.           Everlene FarrierWilliam Sharif Rendell, PA-C 12/24/15 1809  Vanetta MuldersScott Zackowski, MD 12/28/15 2158

## 2015-12-24 NOTE — ED Notes (Signed)
See pa note for secondary assessment 

## 2015-12-24 NOTE — ED Notes (Signed)
Pt states that he has had penile discharge x 1 week. States he wears a condom but one did break. Also c/o pain with urination.

## 2015-12-24 NOTE — Discharge Instructions (Signed)
Sexually Transmitted Disease °A sexually transmitted disease (STD) is a disease or infection that may be passed (transmitted) from person to person, usually during sexual activity. This may happen by way of saliva, semen, blood, vaginal mucus, or urine. Common STDs include: °· Gonorrhea. °· Chlamydia. °· Syphilis. °· HIV and AIDS. °· Genital herpes. °· Hepatitis B and C. °· Trichomonas. °· Human papillomavirus (HPV). °· Pubic lice. °· Scabies. °· Mites. °· Bacterial vaginosis. °WHAT ARE CAUSES OF STDs? °An STD may be caused by bacteria, a virus, or parasites. STDs are often transmitted during sexual activity if one person is infected. However, they may also be transmitted through nonsexual means. STDs may be transmitted after:  °· Sexual intercourse with an infected person. °· Sharing sex toys with an infected person. °· Sharing needles with an infected person or using unclean piercing or tattoo needles. °· Having intimate contact with the genitals, mouth, or rectal areas of an infected person. °· Exposure to infected fluids during birth. °WHAT ARE THE SIGNS AND SYMPTOMS OF STDs? °Different STDs have different symptoms. Some people may not have any symptoms. If symptoms are present, they may include: °· Painful or bloody urination. °· Pain in the pelvis, abdomen, vagina, anus, throat, or eyes. °· A skin rash, itching, or irritation. °· Growths, ulcerations, blisters, or sores in the genital and anal areas. °· Abnormal vaginal discharge with or without bad odor. °· Penile discharge in men. °· Fever. °· Pain or bleeding during sexual intercourse. °· Swollen glands in the groin area. °· Yellow skin and eyes (jaundice). This is seen with hepatitis. °· Swollen testicles. °· Infertility. °· Sores and blisters in the mouth. °HOW ARE STDs DIAGNOSED? °To make a diagnosis, your health care provider may: °· Take a medical history. °· Perform a physical exam. °· Take a sample of any discharge to examine. °· Swab the throat,  cervix, opening to the penis, rectum, or vagina for testing. °· Test a sample of your first morning urine. °· Perform blood tests. °· Perform a Pap test, if this applies. °· Perform a colposcopy. °· Perform a laparoscopy. °HOW ARE STDs TREATED? °Treatment depends on the STD. Some STDs may be treated but not cured. °· Chlamydia, gonorrhea, trichomonas, and syphilis can be cured with antibiotic medicine. °· Genital herpes, hepatitis, and HIV can be treated, but not cured, with prescribed medicines. The medicines lessen symptoms. °· Genital warts from HPV can be treated with medicine or by freezing, burning (electrocautery), or surgery. Warts may come back. °· HPV cannot be cured with medicine or surgery. However, abnormal areas may be removed from the cervix, vagina, or vulva. °· If your diagnosis is confirmed, your recent sexual partners need treatment. This is true even if they are symptom-free or have a negative culture or evaluation. They should not have sex until their health care providers say it is okay. °· Your health care provider may test you for infection again 3 months after treatment. °HOW CAN I REDUCE MY RISK OF GETTING AN STD? °Take these steps to reduce your risk of getting an STD: °· Use latex condoms, dental dams, and water-soluble lubricants during sexual activity. Do not use petroleum jelly or oils. °· Avoid having multiple sex partners. °· Do not have sex with someone who has other sex partners °· Do not have sex with anyone you do not know or who is at high risk for an STD. °· Avoid risky sex practices that can break your skin. °· Do not have sex   if you have open sores on your mouth or skin. °· Avoid drinking too much alcohol or taking illegal drugs. Alcohol and drugs can affect your judgment and put you in a vulnerable position. °· Avoid engaging in oral and anal sex acts. °· Get vaccinated for HPV and hepatitis. If you have not received these vaccines in the past, talk to your health care  provider about whether one or both might be right for you. °· If you are at risk of being infected with HIV, it is recommended that you take a prescription medicine daily to prevent HIV infection. This is called pre-exposure prophylaxis (PrEP). You are considered at risk if: °¨ You are a man who has sex with other men (MSM). °¨ You are a heterosexual man or woman and are sexually active with more than one partner. °¨ You take drugs by injection. °¨ You are sexually active with a partner who has HIV. °· Talk with your health care provider about whether you are at high risk of being infected with HIV. If you choose to begin PrEP, you should first be tested for HIV. You should then be tested every 3 months for as long as you are taking PrEP. °WHAT SHOULD I DO IF I THINK I HAVE AN STD? °· See your health care provider. °· Tell your sexual partner(s). They should be tested and treated for any STDs. °· Do not have sex until your health care provider says it is okay. °WHEN SHOULD I GET IMMEDIATE MEDICAL CARE? °Contact your health care provider right away if:  °· You have severe abdominal pain. °· You are a man and notice swelling or pain in your testicles. °· You are a woman and notice swelling or pain in your vagina. °  °This information is not intended to replace advice given to you by your health care provider. Make sure you discuss any questions you have with your health care provider. °  °Document Released: 09/03/2002 Document Revised: 07/04/2014 Document Reviewed: 01/01/2013 °Elsevier Interactive Patient Education ©2016 [employer] ° °

## 2015-12-24 NOTE — ED Notes (Signed)
Pt came out of room into the hallway stating that he was cold and using very foul language. This RN told pt to please go into his room and i could provide him a blanket. Pt states he was "grown and no one was going to tell him what to do." PA stated to call security to ask pt to go back into room. Pt returned to his room before security needed to come. sercurity and [employer] officer had a conversation with pt while in his room. Pt calm and back in his room at this time. This rn gave pt a blanket which he refused.

## 2015-12-24 NOTE — ED Notes (Signed)
Patient in room on cell phone yelling and using foul language.

## 2015-12-24 NOTE — ED Notes (Signed)
Pa  at bedside. 

## 2015-12-25 LAB — GC/CHLAMYDIA PROBE AMP (~~LOC~~) NOT AT ARMC
Chlamydia: POSITIVE — AB
NEISSERIA GONORRHEA: POSITIVE — AB

## 2015-12-26 ENCOUNTER — Telehealth (HOSPITAL_BASED_OUTPATIENT_CLINIC_OR_DEPARTMENT_OTHER): Payer: Self-pay

## 2015-12-28 ENCOUNTER — Telehealth (HOSPITAL_BASED_OUTPATIENT_CLINIC_OR_DEPARTMENT_OTHER): Payer: Self-pay | Admitting: Emergency Medicine

## 2015-12-28 NOTE — Telephone Encounter (Signed)
+  GC and + chlamydia on 12/24/15 ED visit, [facility] per note in [facility] patient called on 12/27/15 and was informed of same by my colleague and educated re abstinence and notification of sexual partner(s)

## 2016-01-31 ENCOUNTER — Encounter (HOSPITAL_COMMUNITY): Payer: Self-pay | Admitting: Emergency Medicine

## 2016-01-31 ENCOUNTER — Emergency Department (HOSPITAL_COMMUNITY)
Admission: EM | Admit: 2016-01-31 | Discharge: 2016-01-31 | Disposition: A | Discharge status: Home / Self Care | Payer: Medicaid Other | Attending: Emergency Medicine | Admitting: Emergency Medicine

## 2016-01-31 DIAGNOSIS — Y990 Civilian activity done for income or pay: Secondary | ICD-10-CM | POA: Diagnosis not present

## 2016-01-31 DIAGNOSIS — S61216A Laceration without foreign body of right little finger without damage to nail, initial encounter: Secondary | ICD-10-CM | POA: Diagnosis not present

## 2016-01-31 DIAGNOSIS — F909 Attention-deficit hyperactivity disorder, unspecified type: Secondary | ICD-10-CM | POA: Diagnosis not present

## 2016-01-31 DIAGNOSIS — Y9389 Activity, other specified: Secondary | ICD-10-CM | POA: Insufficient documentation

## 2016-01-31 DIAGNOSIS — F17210 Nicotine dependence, cigarettes, uncomplicated: Secondary | ICD-10-CM | POA: Insufficient documentation

## 2016-01-31 DIAGNOSIS — W25XXXA Contact with sharp glass, initial encounter: Secondary | ICD-10-CM | POA: Diagnosis not present

## 2016-01-31 DIAGNOSIS — Y92007 Garden or yard of unspecified non-institutional (private) residence as the place of occurrence of the external cause: Secondary | ICD-10-CM | POA: Insufficient documentation

## 2016-01-31 DIAGNOSIS — S61219A Laceration without foreign body of unspecified finger without damage to nail, initial encounter: Secondary | ICD-10-CM

## 2016-01-31 MED ORDER — TETANUS-DIPHTH-ACELL PERTUSSIS 5-2.5-18.5 LF-MCG/0.5 IM SUSP
0.5000 mL | Freq: Once | INTRAMUSCULAR | Status: AC
  Administered 2016-01-31: 0.5 mL via INTRAMUSCULAR
  Filled 2016-01-31: qty 0.5

## 2016-01-31 MED ORDER — LIDOCAINE HCL (PF) 1 % IJ SOLN
30.0000 mL | Freq: Once | INTRAMUSCULAR | Status: AC
  Administered 2016-01-31: 30 mL via INTRADERMAL
  Filled 2016-01-31: qty 30

## 2016-01-31 NOTE — ED Provider Notes (Signed)
[facility]-EMERGENCY DEPT Provider Note   CSN: [id number] Arrival date & time: 01/31/16  1542  First Provider Contact:   First MD Initiated Contact with Patient 01/31/16 1641     By signing my name below, I, Octavia Heir, attest that this documentation has been prepared under the direction and in the presence of Cheri Fowler, PA-C.  Electronically Signed: Octavia Heir, ED Scribe. 01/31/16. 5:59 PM.    History   Chief Complaint Chief Complaint  Patient presents with  . Laceration   The history is provided by the patient. No language interpreter was used.   HPI Comments: Connor Weber is a 25 y.o. male who presents to the Emergency Department complaining of a sudden onset, gradual worsening, right 5th finger laceration that occurred about 5 hours ago. Pt says that he was doing yard work outside when he picked up some broken glass causing a laceration. Pt says he has tried cold compresses to stop the bleeding with no relief. He denies numbness. He is not up to date on his tetanus shot.   Past Medical History:  Diagnosis Date  . ADHD (attention deficit hyperactivity disorder)   . Anxiety   . Bipolar 1 disorder (HCC)   . Depression   . Manic, depressive [facility])     Patient Active Problem List   Diagnosis Date Noted  . Lactose intolerance 08/09/2014  . Colitis 08/08/2014  . Abdominal pain 08/08/2014  . Bipolar disorder (HCC) 08/08/2014  . Tobacco abuse 08/08/2014  . Alcohol use (HCC) 08/08/2014  . Marijuana abuse 08/08/2014  . Pelvic ascites 08/08/2014  . Abdominal pain, acute 03/18/2013  . Nausea & vomiting 03/18/2013  . Bradycardia 03/18/2013    Past Surgical History:  Procedure Laterality Date  . HAND TENDON SURGERY Right ~ 03/2014       Home Medications    Prior to Admission medications   Not on File    Family History No family history on file.  Social History Social History  Substance Use Topics  . Smoking status: Current Every Day Smoker    Packs/day: 1.00      Years: 10.00    Types: Cigarettes  . Smokeless tobacco: Never Used  . Alcohol use Yes     Comment: 08/10/2014 "maybe one saturday q month I'll have 4-5 shots"     Allergies   Review of patient's allergies indicates no known allergies.   Review of Systems Review of Systems  Skin: Positive for wound.  Neurological: Negative for numbness.     Physical Exam Updated Vital Signs BP 129/83   Pulse 63   Temp 98.8 F (37.1 C) (Oral)   Resp 18   Ht  (1.702 m)   Wt 55.8 kg   SpO2 100%   BMI 19.26 kg/m   Physical Exam  Constitutional: He is oriented to person, place, and time. He appears well-developed and well-nourished.  HENT:  Head: Normocephalic and atraumatic.  Right Ear: External ear normal.  Left Ear: External ear normal.  Eyes: Conjunctivae are normal. No scleral icterus.  Neck: No tracheal deviation present.  Cardiovascular:  Brisk cap refill  Pulmonary/Chest: Effort normal. No respiratory distress.  Abdominal: He exhibits no distension.  Musculoskeletal: Normal range of motion.  Full ROM of right MCP, DIPJ and PIPJ. No tenderness  Neurological: He is alert and oriented to person, place, and time.  Strength and sensation intact distal to injury  Skin: Skin is warm and dry.  Superficial 1 cm curved laceration to the right  5th digit in between DIPJ and PIPJ  Psychiatric: He has a normal mood and affect. His behavior is normal.  Nursing note and vitals reviewed.    ED Treatments / Results  DIAGNOSTIC STUDIES: Oxygen Saturation is 100% on RA, normal by my interpretation.  COORDINATION OF CARE:  4:48 PM Discussed treatment plan which includes suture the area and update tetanus shot with pt at bedside and pt agreed to plan.  Labs (all labs ordered are listed, but only abnormal results are displayed) Labs Reviewed - No data to display  EKG  EKG Interpretation None       Radiology No results found.  Procedures Procedures (including critical  care time)  NERVE BLOCK Performed by: Cheri FowlerKayla Grizelda Piscopo Consent: Verbal consent obtained. Required items: required blood products, implants, devices, and special equipment available Time out: Immediately prior to procedure a "time out" was called to verify the correct patient, procedure, equipment, support staff and site/side marked as required.  Indication: lac repair Nerve block body site: right pinky  Preparation: Patient was prepped and draped in the usual sterile fashion. Needle gauge: 25 G Location technique: anatomical landmarks  Local anesthetic: lidocaine 1% w/out epi  Anesthetic total: 6 ml  Outcome: pain improved Patient tolerance: Patient tolerated the procedure well with no immediate complications.   LACERATION REPAIR Performed by: Cheri FowlerKayla Antoinette Borgwardt Consent: Verbal consent obtained. Risks and benefits: risks, benefits and alternatives were discussed Patient identity confirmed: provided demographic data Time out performed prior to procedure Prepped and Draped in normal sterile fashion Wound explored Laceration Location: right pinky Laceration Length: 1cm No Foreign Bodies seen or palpated Anesthesia: digital block Irrigation method: syringe Amount of cleaning: standard Skin closure: 5-0 ethilon Number of sutures or staples: 2 Technique: simple interrupted Patient tolerance: Patient tolerated the procedure well with no immediate complications.   Medications Ordered in ED Medications  lidocaine (PF) (XYLOCAINE) 1 % injection 30 mL (30 mLs Intradermal Given 01/31/16 1703)  Tdap (BOOSTRIX) injection 0.5 mL (0.5 mLs Intramuscular Given 01/31/16 1703)     Initial Impression / Assessment and Plan / ED Course  I have reviewed the triage vital signs and the nursing notes.  Pertinent labs & imaging results that were available during my care of the patient were reviewed by me and considered in my medical decision making (see chart for details).  Clinical Course      Final  Clinical Impressions(s) / ED Diagnoses   Final diagnoses:  Laceration of finger, initial encounter   Tetanus updated. Laceration occurred < 12 hours prior to repair. Discussed laceration care with pt and answered questions. Pt to f-u for suture removal in 7-10 days and wound check sooner should there be signs of dehiscence or infection. Pt is hemodynamically stable with no complaints prior to dc.    I personally performed the services described in this documentation, which was scribed in my presence. The recorded information has been reviewed and is accurate.  New Prescriptions New Prescriptions   No medications on file     Cheri FowlerKayla Ellias Mcelreath, PA-C 01/31/16 1800    Geoffery Lyonsouglas Delo, MD 02/01/16 1535

## 2016-01-31 NOTE — ED Notes (Signed)
Pt finger placed in 50/50 iodine/sterile water to soak.  Lac approx 4 cms with skin flap

## 2016-01-31 NOTE — ED Triage Notes (Signed)
Pt was doing yard work today and cut his right pinky finger on glass in the grass. Pt has tried cold compress to stop the bleeding with no relief.

## 2016-01-31 NOTE — Discharge Instructions (Signed)
Return to this department or your primary care physician for suture removal in 7-10 days.  Return sooner if you experience red streaks, pus, or increased swelling from your wound.

## 2017-04-11 ENCOUNTER — Emergency Department (HOSPITAL_COMMUNITY)
Admission: EM | Admit: 2017-04-11 | Discharge: 2017-04-11 | Disposition: A | Discharge status: Home / Self Care | Payer: Medicaid Other | Attending: Emergency Medicine | Admitting: Emergency Medicine

## 2017-04-11 ENCOUNTER — Encounter (HOSPITAL_COMMUNITY): Payer: Self-pay

## 2017-04-11 ENCOUNTER — Emergency Department (HOSPITAL_COMMUNITY): Payer: Medicaid Other

## 2017-04-11 DIAGNOSIS — R112 Nausea with vomiting, unspecified: Secondary | ICD-10-CM | POA: Diagnosis present

## 2017-04-11 DIAGNOSIS — R319 Hematuria, unspecified: Secondary | ICD-10-CM | POA: Insufficient documentation

## 2017-04-11 DIAGNOSIS — F12988 Cannabis use, unspecified with other cannabis-induced disorder: Secondary | ICD-10-CM | POA: Insufficient documentation

## 2017-04-11 DIAGNOSIS — N390 Urinary tract infection, site not specified: Secondary | ICD-10-CM | POA: Diagnosis not present

## 2017-04-11 DIAGNOSIS — F17210 Nicotine dependence, cigarettes, uncomplicated: Secondary | ICD-10-CM | POA: Diagnosis not present

## 2017-04-11 DIAGNOSIS — F909 Attention-deficit hyperactivity disorder, unspecified type: Secondary | ICD-10-CM | POA: Diagnosis not present

## 2017-04-11 LAB — COMPREHENSIVE METABOLIC PANEL
ALBUMIN: 4.2 g/dL (ref 3.5–5.0)
ALT: 20 U/L (ref 17–63)
AST: 28 U/L (ref 15–41)
Alkaline Phosphatase: 67 U/L (ref 38–126)
Anion gap: 7 (ref 5–15)
BUN: 10 mg/dL (ref 6–20)
CHLORIDE: 107 mmol/L (ref 101–111)
CO2: 25 mmol/L (ref 22–32)
Calcium: 9.5 mg/dL (ref 8.9–10.3)
Creatinine, Ser: 0.99 mg/dL (ref 0.61–1.24)
GFR calc Af Amer: >60 mL/min (ref >60)
GFR calc non Af Amer: >60 mL/min (ref >60)
GLUCOSE: 91 mg/dL (ref 65–99)
POTASSIUM: 4.3 mmol/L (ref 3.5–5.1)
SODIUM: 139 mmol/L (ref 135–145)
Total Bilirubin: 0.8 mg/dL (ref 0.3–1.2)
Total Protein: 7.2 g/dL (ref 6.5–8.1)

## 2017-04-11 LAB — CBC
HEMATOCRIT: 36.8 % — AB (ref 39.0–52.0)
Hemoglobin: 11.5 g/dL — ABNORMAL LOW (ref 13.0–17.0)
MCH: 27.4 pg (ref 26.0–34.0)
MCHC: 31.3 g/dL (ref 30.0–36.0)
MCV: 87.6 fL (ref 78.0–100.0)
Platelets: 227 10*3/uL (ref 150–400)
RBC: 4.2 MIL/uL — ABNORMAL LOW (ref 4.22–5.81)
RDW: 14.8 % (ref 11.5–15.5)
WBC: 5.6 10*3/uL (ref 4.0–10.5)

## 2017-04-11 LAB — URINALYSIS, ROUTINE W REFLEX MICROSCOPIC
BILIRUBIN URINE: NEGATIVE
GLUCOSE, UA: NEGATIVE mg/dL
HGB URINE DIPSTICK: NEGATIVE
KETONES UR: NEGATIVE mg/dL
NITRITE: NEGATIVE
PROTEIN: 30 mg/dL — AB
Specific Gravity, Urine: 1.024 (ref 1.005–1.030)
pH: 8 (ref 5.0–8.0)

## 2017-04-11 LAB — LIPASE, BLOOD: LIPASE: 22 U/L (ref 11–51)

## 2017-04-11 MED ORDER — ONDANSETRON 4 MG PO TBDP
4.0000 mg | ORAL_TABLET | Freq: Once | ORAL | Status: AC
  Administered 2017-04-11: 4 mg via ORAL
  Filled 2017-04-11: qty 1

## 2017-04-11 MED ORDER — ONDANSETRON 4 MG PO TBDP
ORAL_TABLET | ORAL | Status: AC
  Filled 2017-04-11: qty 1

## 2017-04-11 MED ORDER — DICYCLOMINE HCL 10 MG PO CAPS
10.0000 mg | ORAL_CAPSULE | Freq: Once | ORAL | Status: AC
  Administered 2017-04-11: 10 mg via ORAL
  Filled 2017-04-11: qty 1

## 2017-04-11 MED ORDER — HALOPERIDOL LACTATE 5 MG/ML IJ SOLN
2.0000 mg | Freq: Once | INTRAMUSCULAR | Status: AC
  Administered 2017-04-11: 2 mg via INTRAMUSCULAR
  Filled 2017-04-11: qty 1

## 2017-04-11 MED ORDER — SULFAMETHOXAZOLE-TRIMETHOPRIM 800-160 MG PO TABS: 1.0000 | ORAL_TABLET | Freq: Two times a day (BID) | ORAL | 0 refills | Status: AC

## 2017-04-11 MED ORDER — DIPHENHYDRAMINE HCL 50 MG/ML IJ SOLN
25.0000 mg | Freq: Once | INTRAMUSCULAR | Status: AC
  Administered 2017-04-11: 25 mg via INTRAVENOUS
  Filled 2017-04-11: qty 1

## 2017-04-11 MED ORDER — SODIUM CHLORIDE 0.9 % IV SOLN
8.0000 mg | Freq: Once | INTRAVENOUS | Status: AC
  Administered 2017-04-11: 8 mg via INTRAVENOUS
  Filled 2017-04-11: qty 4

## 2017-04-11 MED ORDER — LIDOCAINE HCL (PF) 1 % IJ SOLN
INTRAMUSCULAR | Status: AC
  Filled 2017-04-11: qty 5

## 2017-04-11 MED ORDER — CIPROFLOXACIN HCL 500 MG PO TABS: 500.0000 mg | ORAL_TABLET | Freq: Two times a day (BID) | ORAL | 0 refills | Status: DC

## 2017-04-11 MED ORDER — PROMETHAZINE HCL 25 MG PO TABS: 25.0000 mg | ORAL_TABLET | Freq: Four times a day (QID) | ORAL | 0 refills | Status: DC | PRN

## 2017-04-11 MED ORDER — CEFTRIAXONE SODIUM 250 MG IJ SOLR
250.0000 mg | INTRAMUSCULAR | Status: DC
  Administered 2017-04-11: 250 mg via INTRAMUSCULAR
  Filled 2017-04-11: qty 250

## 2017-04-11 MED ORDER — AZITHROMYCIN 250 MG PO TABS
1000.0000 mg | ORAL_TABLET | Freq: Once | ORAL | Status: AC
  Administered 2017-04-11: 1000 mg via ORAL
  Filled 2017-04-11: qty 4

## 2017-04-11 NOTE — ED Notes (Signed)
Pt vomiting in room, now asking for the Zofran previously ordered and refused.

## 2017-04-11 NOTE — Discharge Instructions (Signed)
Daily Marijuana use can cause nausea and vomiting that can persists for days and does not respond to most nausea medications. Take antibiotic as prescribed until gone. See your PCP for follow up appointment if not improving.

## 2017-04-11 NOTE — ED Notes (Signed)
Pt vomiting again in room, no relief from Zofran and not tolerating fluids

## 2017-04-11 NOTE — ED Triage Notes (Signed)
Patient complains of generalized abdominal pain with vomiting since am, denies diarrhea. Describes as cramping pain. Alert and oriented

## 2017-04-11 NOTE — ED Notes (Signed)
Got patient on the monitor patient is resting with call bell in reach  ?

## 2017-04-12 NOTE — ED Provider Notes (Signed)
[facility] EMERGENCY DEPARTMENT Provider Note   CSN: [id number] Arrival date & time: 04/11/17  4098     History   Chief Complaint Chief Complaint  Patient presents with  . Abdominal Pain  . Emesis    HPI Connor Weber is a 26 y.o. male. Chief complaint is vomiting  HPI: previously healthy 82, male. 60s smokes marijuana "a lot, every day. He awakened this morning with nausea. He's had intermittent episodes of nausea over the last few weeks. No fever. Some abdominal cramping but no frank pain. Does report some dysuria. No discharge. He had a new sexual partner 2 weeks ago. Unprotected intercourse. Has not noticed discharge,lesions, or blistering. No testicular pain. No groin swelling.  Past Medical History:  Diagnosis Date  . ADHD (attention deficit hyperactivity disorder)   . Anxiety   . Bipolar 1 disorder (HCC)   . Depression   . Manic, depressive [facility])     Patient Active Problem List   Diagnosis Date Noted  . Lactose intolerance 08/09/2014  . Colitis 08/08/2014  . Abdominal pain 08/08/2014  . Bipolar disorder (HCC) 08/08/2014  . Tobacco abuse 08/08/2014  . Alcohol use 08/08/2014  . Marijuana abuse 08/08/2014  . Pelvic ascites 08/08/2014  . Abdominal pain, acute 03/18/2013  . Nausea & vomiting 03/18/2013  . Bradycardia 03/18/2013    Past Surgical History:  Procedure Laterality Date  . HAND TENDON SURGERY Right ~ 03/2014       Home Medications    Prior to Admission medications   Medication Sig Start Date End Date Taking? Authorizing Provider  ciprofloxacin (CIPRO) 500 MG tablet Take 1 tablet (500 mg total) by mouth 2 (two) times daily. 04/11/17   Rolland Porter, MD  promethazine (PHENERGAN) 25 MG tablet Take 1 tablet (25 mg total) by mouth every 6 (six) hours as needed for nausea or vomiting. 04/11/17   Rolland Porter, MD  promethazine (PHENERGAN) 25 MG tablet Take 1 tablet (25 mg total) by mouth every 6 (six) hours as needed for nausea or  vomiting. 04/11/17   Rolland Porter, MD  sulfamethoxazole-trimethoprim (BACTRIM DS,SEPTRA DS) 800-160 MG tablet Take 1 tablet by mouth 2 (two) times daily. 04/11/17 04/18/17  Rolland Porter, MD    Family History No family history on file.  Social History Social History  Substance Use Topics  . Smoking status: Current Every Day Smoker    Packs/day: 1.00    Years: 10.00    Types: Cigarettes  . Smokeless tobacco: Never Used  . Alcohol use Yes     Comment: 08/10/2014 "maybe one saturday q month I'll have 4-5 shots"     Allergies   Patient has no known allergies.   Review of Systems Review of Systems  Constitutional: Negative for appetite change, chills, diaphoresis, fatigue and fever.  HENT: Negative for mouth sores, sore throat and trouble swallowing.   Eyes: Negative for visual disturbance.  Respiratory: Negative for cough, chest tightness, shortness of breath and wheezing.   Cardiovascular: Negative for chest pain.  Gastrointestinal: Positive for abdominal pain, nausea and vomiting. Negative for abdominal distention and diarrhea.  Endocrine: Negative for polydipsia, polyphagia and polyuria.  Genitourinary: Positive for dysuria. Negative for frequency and hematuria.  Musculoskeletal: Negative for gait problem.  Skin: Negative for color change, pallor and rash.  Neurological: Negative for dizziness, syncope, light-headedness and headaches.  Hematological: Does not bruise/bleed easily.  Psychiatric/Behavioral: Negative for behavioral problems and confusion.     Physical Exam Updated Vital Signs BP 112/80  Pulse (!) 46   Temp 97.6 F (36.4 C) (Oral)   Resp 16   SpO2 98%   Physical Exam  Constitutional: He is oriented to person, place, and time. He appears well-developed and well-nourished. No distress.  HENT:  Head: Normocephalic.  Eyes: Pupils are equal, round, and reactive to light. Conjunctivae are normal. No scleral icterus.  Neck: Normal range of motion. Neck supple.  No thyromegaly present.  Cardiovascular: Normal rate and regular rhythm.  Exam reveals no gallop and no friction rub.   No murmur heard. Pulmonary/Chest: Effort normal and breath sounds normal. No respiratory distress. He has no wheezes. He has no rales.  Abdominal: Soft. Bowel sounds are normal. He exhibits no distension. There is no tenderness. There is no rebound.  Soft benign abdomen. No complaint of wrist palpation. No guarding. No peritoneal irritation. No localizing tenderness  Genitourinary:  Genitourinary Comments: No discharge. No testicular swelling. No abnormal adenopathy. No lesions.  Musculoskeletal: Normal range of motion.  Neurological: He is alert and oriented to person, place, and time.  Skin: Skin is warm and dry. No rash noted.  Psychiatric: He has a normal mood and affect. His behavior is normal.     ED Treatments / Results  Labs (all labs ordered are listed, but only abnormal results are displayed) Labs Reviewed  CBC - Abnormal; Notable for the following:       Result Value   RBC 4.20 (*)    Hemoglobin 11.5 (*)    HCT 36.8 (*)    All other components within normal limits  URINALYSIS, ROUTINE W REFLEX MICROSCOPIC - Abnormal; Notable for the following:    APPearance CLOUDY (*)    Protein, ur 30 (*)    Leukocytes, UA MODERATE (*)    Bacteria, UA RARE (*)    Squamous Epithelial / LPF 0-5 (*)    All other components within normal limits  LIPASE, BLOOD  COMPREHENSIVE METABOLIC PANEL  GC/CHLAMYDIA PROBE AMP ([place]) NOT AT [facility]    EKG  EKG Interpretation None       Radiology Ct Renal Stone Study  Result Date: 04/11/2017 CLINICAL DATA:  Bilateral flank pain and vomiting. EXAM: CT ABDOMEN AND PELVIS WITHOUT CONTRAST TECHNIQUE: Multidetector CT imaging of the abdomen and pelvis was performed following the standard protocol without IV contrast. COMPARISON:  CT abdomen pelvis dated August 08, 2014. FINDINGS: Lower chest: No acute abnormality.  Hepatobiliary: No focal liver abnormality is seen. No gallstones, gallbladder wall thickening, or biliary dilatation. Pancreas: Unremarkable. No pancreatic ductal dilatation or surrounding inflammatory changes. Spleen: Normal in size without focal abnormality. Adrenals/Urinary Tract: Adrenal glands are unremarkable. Questionable hyperdensity of the bilateral renal medullary pyramids. No calculi, focal lesion, or hydronephrosis. Bladder is unremarkable. Stomach/Bowel: Stomach is within normal limits. Appendix appears normal. No evidence of bowel wall thickening, distention, or inflammatory changes. Vascular/Lymphatic: No significant vascular findings are present. No enlarged abdominal or pelvic lymph nodes. Reproductive: Prostate is unremarkable. Other: No abdominal wall hernia or abnormality. No abdominopelvic ascites. Musculoskeletal: No acute or significant osseous findings. IMPRESSION: 1. Questionable increased density of the bilateral renal medullary pyramids, which could reflect medullary nephrocalcinosis. No renal calculi or hydronephrosis. Electronically Signed   By: Obie Dredge M.D.   On: 04/11/2017 14:29    Procedures Procedures (including critical care time)  Medications Ordered in ED Medications  ondansetron (ZOFRAN-ODT) disintegrating tablet 4 mg (4 mg Oral Given 04/11/17 1216)  dicyclomine (BENTYL) capsule 10 mg (10 mg Oral Given 04/11/17 1128)  azithromycin (  ZITHROMAX) tablet 1,000 mg (1,000 mg Oral Given 04/11/17 1128)  ondansetron (ZOFRAN) 8 mg in sodium chloride 0.9 % 50 mL IVPB (0 mg Intravenous Stopped 04/11/17 1315)  haloperidol lactate (HALDOL) injection 2 mg (2 mg Intramuscular Given 04/11/17 1537)  diphenhydrAMINE (BENADRYL) injection 25 mg (25 mg Intravenous Given 04/11/17 1538)     Initial Impression / Assessment and Plan / ED Course  I have reviewed the triage vital signs and the nursing notes.  Pertinent labs & imaging results that were available during my care of  the patient were reviewed by me and considered in my medical decision making (see chart for details).    IV placed. Given IV fluids and IV antiemetics after patient failed by mouth Zofran. Has persistent vomiting. Was given Phenergan, and ultimately Haldol before had resolution of his nausea. CT scan shows no acute abnormalities. Does not have leukocytosis. He has too numerous to count leukocytes and bacteria on urinalysis with triple phosphate crystals. This can be indicative of Proteus infection. We'll use Cipro as an outpatient. Was treated for possible STI with Rocephin 250, and by mouth Zofran. I discussed with him at length that his nausea and vomiting and symptoms may actually be THC hyperemesis. He expressed at least an initial limited understanding of this.Discharge home Zofran, Cipro, primary care follow-up.  Final Clinical Impressions(s) / ED Diagnoses   Final diagnoses:  Urinary tract infection with hematuria, site unspecified  Cannabinoid hyperemesis syndrome (HCC)    New Prescriptions Discharge Medication List as of 04/11/2017  3:46 PM    START taking these medications   Details  ciprofloxacin (CIPRO) 500 MG tablet Take 1 tablet (500 mg total) by mouth 2 (two) times daily., Starting Tue 04/11/2017, Print    !! promethazine (PHENERGAN) 25 MG tablet Take 1 tablet (25 mg total) by mouth every 6 (six) hours as needed for nausea or vomiting., Starting Tue 04/11/2017, Print    !! promethazine (PHENERGAN) 25 MG tablet Take 1 tablet (25 mg total) by mouth every 6 (six) hours as needed for nausea or vomiting., Starting Tue 04/11/2017, Print    sulfamethoxazole-trimethoprim (BACTRIM DS,SEPTRA DS) 800-160 MG tablet Take 1 tablet by mouth 2 (two) times daily., Starting Tue 04/11/2017, Until Tue 04/18/2017, Print     !! - Potential duplicate medications found. Please discuss with provider.       Rolland PorterJames, Antonios Ostrow, MD 04/12/17 [phone]

## 2017-07-21 ENCOUNTER — Encounter (HOSPITAL_BASED_OUTPATIENT_CLINIC_OR_DEPARTMENT_OTHER): Payer: Self-pay | Admitting: Emergency Medicine

## 2017-07-21 ENCOUNTER — Emergency Department (HOSPITAL_BASED_OUTPATIENT_CLINIC_OR_DEPARTMENT_OTHER)
Admission: EM | Admit: 2017-07-21 | Discharge: 2017-07-21 | Disposition: A | Discharge status: Home / Self Care | Payer: Medicaid Other | Attending: Emergency Medicine | Admitting: Emergency Medicine

## 2017-07-21 ENCOUNTER — Other Ambulatory Visit: Payer: Self-pay

## 2017-07-21 DIAGNOSIS — R369 Urethral discharge, unspecified: Secondary | ICD-10-CM | POA: Diagnosis present

## 2017-07-21 DIAGNOSIS — R309 Painful micturition, unspecified: Secondary | ICD-10-CM | POA: Diagnosis not present

## 2017-07-21 DIAGNOSIS — Z202 Contact with and (suspected) exposure to infections with a predominantly sexual mode of transmission: Secondary | ICD-10-CM | POA: Diagnosis not present

## 2017-07-21 DIAGNOSIS — Z79899 Other long term (current) drug therapy: Secondary | ICD-10-CM | POA: Insufficient documentation

## 2017-07-21 DIAGNOSIS — F1210 Cannabis abuse, uncomplicated: Secondary | ICD-10-CM | POA: Diagnosis not present

## 2017-07-21 DIAGNOSIS — F17210 Nicotine dependence, cigarettes, uncomplicated: Secondary | ICD-10-CM | POA: Insufficient documentation

## 2017-07-21 DIAGNOSIS — R300 Dysuria: Secondary | ICD-10-CM | POA: Diagnosis not present

## 2017-07-21 MED ORDER — METRONIDAZOLE 500 MG PO TABS
2000.0000 mg | ORAL_TABLET | Freq: Once | ORAL | Status: AC
  Administered 2017-07-21: 2000 mg via ORAL
  Filled 2017-07-21: qty 4

## 2017-07-21 MED ORDER — AZITHROMYCIN 250 MG PO TABS
1000.0000 mg | ORAL_TABLET | Freq: Once | ORAL | Status: AC
  Administered 2017-07-21: 1000 mg via ORAL
  Filled 2017-07-21: qty 4

## 2017-07-21 MED ORDER — CEFTRIAXONE SODIUM 250 MG IJ SOLR
250.0000 mg | Freq: Once | INTRAMUSCULAR | Status: AC
  Administered 2017-07-21: 250 mg via INTRAMUSCULAR
  Filled 2017-07-21: qty 250

## 2017-07-21 NOTE — ED Notes (Signed)
ED Provider at bedside. 

## 2017-07-21 NOTE — ED Triage Notes (Signed)
Patient reports penile discharge yesterday.  Reports sexually active with multiple partners.  Unprotected sex.

## 2017-07-21 NOTE — ED Provider Notes (Signed)
[facility] EMERGENCY DEPARTMENT Provider Note   CSN: [id number] Arrival date & time: 07/21/17  4098     History   Chief Complaint Chief Complaint  Patient presents with  . Penile Discharge    HPI Connor Weber is a 27 y.o. male.  HPI   27 year old male with no significant medical history presents with concern for penile discharge.  Patient reports a discharge began yesterday.  Reports he has a history of STI in the past, and the flank pain, abdominal pain, nausea or vomiting.  Reports white yellow discharge, and slight pain with urination.  Reports he typically uses condoms, however at a recent party, he did not, and is concerned he had exposure to possible STI.  He declines further testing for HIV and syphilis at this time reports he will see his primary care doctor regarding that.  History reviewed. No pertinent past medical history.  There are no active problems to display for this patient.   Past Surgical History:  Procedure Laterality Date  . WRIST SURGERY         Home Medications    Prior to Admission medications   Medication Sig Start Date End Date Taking? Authorizing Provider  lisdexamfetamine (VYVANSE) 30 MG capsule Take 30 mg by mouth daily as needed. When angry to calm down    [provider]  methocarbamol (ROBAXIN) 500 MG tablet Take 1 tablet (500 mg total) by mouth 2 (two) times daily. 10/01/15   Fayrene Helper, PA-C  naproxen (NAPROSYN) 500 MG tablet Take 1 tablet (500 mg total) by mouth 2 (two) times daily. 10/01/15   Fayrene Helper, PA-C  PRESCRIPTION MEDICATION Take 1 tablet by mouth daily. For adhd and bipolar ( on 2 different meds)    [provider]    Family History History reviewed. No pertinent family history.  Social History Social History   Tobacco Use  . Smoking status: Current Every Day Smoker    Packs/day: 1.00    Types: Cigarettes  . Smokeless tobacco: Never Used  Substance Use Topics  . Alcohol use: Yes  .  Drug use: Yes    Types: Marijuana     Allergies   Patient has no known allergies.   Review of Systems Review of Systems  Gastrointestinal: Negative for abdominal pain, nausea and vomiting.  Genitourinary: Positive for discharge and dysuria. Negative for difficulty urinating, flank pain and testicular pain.  Skin: Negative for rash and wound.     Physical Exam Updated Vital Signs BP (!) 130/97 (BP Location: Left Arm)   Pulse 64   Temp 98.8 F (37.1 C) (Oral)   Resp 18   Ht 5\' 6"  (1.676 m)   Wt 72.6 kg (160 lb)   SpO2 100%   BMI 25.82 kg/m   Physical Exam  Constitutional: He is oriented to person, place, and time. He appears well-developed and well-nourished. No distress.  HENT:  Head: Normocephalic and atraumatic.  Eyes: Conjunctivae and EOM are normal.  Cardiovascular: Normal rate.  Pulmonary/Chest: Effort normal. No respiratory distress.  Neurological: He is alert and oriented to person, place, and time.  Skin: He is not diaphoretic.  Nursing note and vitals reviewed.    ED Treatments / Results  Labs (all labs ordered are listed, but only abnormal results are displayed) Labs Reviewed  GC/CHLAMYDIA PROBE AMP ([place]) NOT AT [facility]    EKG  EKG Interpretation None       Radiology No results found.  Procedures Procedures (including critical  care time)  Medications Ordered in ED Medications  cefTRIAXone (ROCEPHIN) injection 250 mg (not administered)  azithromycin (ZITHROMAX) tablet 1,000 mg (not administered)  metroNIDAZOLE (FLAGYL) tablet 2,000 mg (not administered)     Initial Impression / Assessment and Plan / ED Course  I have reviewed the triage vital signs and the nursing notes.  Pertinent labs & imaging results that were available during my care of the patient were reviewed by me and considered in my medical decision making (see chart for details).     27 year old male with no significant medical history presents with concern for  penile discharge and possible STI exposure. Denies rash or skin lesions. Declines HIV/RPR at this time and reports he will follow up with PCP. GC/Chl urine ordered. Given empiric rocephin/azithromycin/flagyl. Discussed safe sex practices. Patient discharged in stable condition with understanding of reasons to return.   Final Clinical Impressions(s) / ED Diagnoses   Final diagnoses:  Potential exposure to STD    ED Discharge Orders    None       Alvira MondaySchlossman, Breonna Gafford, MD 07/21/17 1037

## 2017-07-24 LAB — GC/CHLAMYDIA PROBE AMP (~~LOC~~) NOT AT ARMC
Chlamydia: NEGATIVE
Neisseria Gonorrhea: POSITIVE — AB

## 2017-09-22 ENCOUNTER — Emergency Department (HOSPITAL_COMMUNITY)
Admission: EM | Admit: 2017-09-22 | Discharge: 2017-09-22 | Disposition: A | Discharge status: Home / Self Care | Payer: Medicaid Other | Attending: Emergency Medicine | Admitting: Emergency Medicine

## 2017-09-22 ENCOUNTER — Encounter (HOSPITAL_COMMUNITY): Payer: Self-pay | Admitting: *Deleted

## 2017-09-22 DIAGNOSIS — F1210 Cannabis abuse, uncomplicated: Secondary | ICD-10-CM | POA: Insufficient documentation

## 2017-09-22 DIAGNOSIS — F17210 Nicotine dependence, cigarettes, uncomplicated: Secondary | ICD-10-CM | POA: Diagnosis not present

## 2017-09-22 DIAGNOSIS — L02412 Cutaneous abscess of left axilla: Secondary | ICD-10-CM | POA: Insufficient documentation

## 2017-09-22 DIAGNOSIS — L0291 Cutaneous abscess, unspecified: Secondary | ICD-10-CM | POA: Diagnosis present

## 2017-09-22 MED ORDER — IBUPROFEN 600 MG PO TABS: 600.0000 mg | ORAL_TABLET | Freq: Four times a day (QID) | ORAL | 0 refills | Status: DC | PRN

## 2017-09-22 MED ORDER — LIDOCAINE-EPINEPHRINE (PF) 2 %-1:200000 IJ SOLN
10.0000 mL | Freq: Once | INTRAMUSCULAR | Status: AC
  Administered 2017-09-22: 10 mL
  Filled 2017-09-22: qty 20

## 2017-09-22 NOTE — ED Triage Notes (Signed)
Pt in c/o L arm abcess onset x 1 wk, pt has 2 cm abcess present, with redness & tenderness present, induration present, denies fever & chills, A&O x4

## 2017-09-22 NOTE — ED Provider Notes (Signed)
[facility] EMERGENCY DEPARTMENT Provider Note   CSN: [id number] Arrival date & time: 09/22/17  1220     History   Chief Complaint Chief Complaint  Patient presents with  . Abscess    HPI Connor Weber is a 27 y.o. male without history of immunosuppression who presents the emergency department today for abscess of the left axilla.  Patient notes that 2-3 days ago he started noticing pain underneath his left armpit.  He thinks this may be from changing his deodorant.  Since that time he has noticed pain, swelling and now redness over this area.  There is no drainage.  He has taken over-the-counter medication for this without relief.  He notes pain is worsened by palpation and movement of his arm.  He notes it is very irritated at work as he has to lift heavy objects frequently.  He denies any associated fever, nausea/vomiting, or other systemic symptoms.  HPI  Past Medical History:  Diagnosis Date  . ADHD (attention deficit hyperactivity disorder)   . Anxiety   . Bipolar 1 disorder (HCC)   . Depression   . Manic, depressive [facility])     Patient Active Problem List   Diagnosis Date Noted  . Lactose intolerance 08/09/2014  . Colitis 08/08/2014  . Abdominal pain 08/08/2014  . Bipolar disorder (HCC) 08/08/2014  . Tobacco abuse 08/08/2014  . Alcohol use 08/08/2014  . Marijuana abuse 08/08/2014  . Pelvic ascites 08/08/2014  . Abdominal pain, acute 03/18/2013  . Nausea & vomiting 03/18/2013  . Bradycardia 03/18/2013    Past Surgical History:  Procedure Laterality Date  . HAND TENDON SURGERY Right ~ 03/2014        Home Medications    Prior to Admission medications   Medication Sig Start Date End Date Taking? Authorizing Provider  ciprofloxacin (CIPRO) 500 MG tablet Take 1 tablet (500 mg total) by mouth 2 (two) times daily. 04/11/17   Rolland Porter, MD  promethazine (PHENERGAN) 25 MG tablet Take 1 tablet (25 mg total) by mouth every 6 (six) hours as needed  for nausea or vomiting. 04/11/17   Rolland Porter, MD  promethazine (PHENERGAN) 25 MG tablet Take 1 tablet (25 mg total) by mouth every 6 (six) hours as needed for nausea or vomiting. 04/11/17   Rolland Porter, MD    Family History No family history on file.  Social History Social History   Tobacco Use  . Smoking status: Current Every Day Smoker    Packs/day: 1.00    Years: 10.00    Pack years: 10.00    Types: Cigarettes  . Smokeless tobacco: Never Used  Substance Use Topics  . Alcohol use: Yes    Comment: 08/10/2014 "maybe one saturday q month I'll have 4-5 shots"  . Drug use: Yes    Types: Marijuana    Comment: 08/08/2014 "smoke marijuana qd"     Allergies   Patient has no known allergies.   Review of Systems Review of Systems  All other systems reviewed and are negative.    Physical Exam Updated Vital Signs BP (!) 127/96 (BP Location: Right Arm)   Pulse 75   Temp 98.2 F (36.8 C) (Oral)   Resp 14   SpO2 100%   Physical Exam  Constitutional: He appears well-developed and well-nourished.  HENT:  Head: Normocephalic and atraumatic.  Right Ear: External ear normal.  Left Ear: External ear normal.  Eyes: Conjunctivae are normal. Right eye exhibits no discharge. Left eye exhibits no discharge.  No scleral icterus.  Cardiovascular:  Pulses:      Radial pulses are 2+ on the left side.  Pulmonary/Chest: Effort normal. No respiratory distress.  Neurological: He is alert.  Skin: Skin is warm and dry. Capillary refill takes less than 2 seconds. No pallor.  In the left axilla there is a 3 cm x 1 cm fluctuant area with minimal overlying erythema and heat.  No surrounding induration.  No drainage.  Psychiatric: He has a normal mood and affect.  Nursing note and vitals reviewed.    ED Treatments / Results  Labs (all labs ordered are listed, but only abnormal results are displayed) Labs Reviewed - No data to display  EKG None  Radiology No results  found.  Procedures .Marland Kitchen.Incision and Drainage Date/Time: 09/22/2017 4:59 PM Performed by: Jacinto HalimMaczis, Michael M, PA-C Authorized by: Jacinto HalimMaczis, Michael M, PA-C   Consent:    Consent obtained:  Verbal   Consent given by:  Patient   Risks discussed:  Bleeding, damage to other organs, incomplete drainage, infection and pain   Alternatives discussed:  No treatment Location:    Type:  Abscess   Size:  3   Location:  Upper extremity   Upper extremity location: axilla. Pre-procedure details:    Skin preparation:  Betadine Anesthesia (see MAR for exact dosages):    Anesthesia method:  Local infiltration   Local anesthetic:  Lidocaine 2% WITH epi Procedure type:    Complexity:  Simple Procedure details:    Incision types:  Single straight   Scalpel blade:  11   Wound management:  Probed and deloculated   Drainage:  Purulent   Drainage amount:  Copious   Wound treatment:  Wound left open   Packing materials:  None Post-procedure details:    Patient tolerance of procedure:  Tolerated well, no immediate complications   (including critical care time)  Medications Ordered in ED Medications - No data to display   Initial Impression / Assessment and Plan / ED Course  I have reviewed the triage vital signs and the nursing notes.  Pertinent labs & imaging results that were available during my care of the patient were reviewed by me and considered in my medical decision making (see chart for details).     27 y.o. male with abscess to the left axilla. Patient with skin abscess amenable to incision and drainage.  Abscess was not large enough to warrant packing or drain,  wound recheck in 2 days. Encouraged home warm soaks and flushing.  Mild signs of cellulitis is surrounding skin.  Will d/c to home.  No antibiotic therapy is indicated. Return precautions discussed. Appears safe for d/c.   Final Clinical Impressions(s) / ED Diagnoses   Final diagnoses:  Abscess    ED Discharge Orders         Ordered    ibuprofen (ADVIL,MOTRIN) 600 MG tablet  Every 6 hours PRN     09/22/17 1704       Jacinto HalimMaczis, Michael M, PA-C 09/22/17 1704    Melene PlanFloyd, Dan, DO 09/22/17 2252

## 2017-09-22 NOTE — Discharge Instructions (Addendum)
Please read and follow all provided instructions.  You were seen here today for an Abscess. For this, an incision and drainage (aka an I&D) to the affected area was done today. An I&D is a surgical procedure to open and drain a fluid-filled sac that may be filled with pus, mucus, or blood. Examples of fluid-filled sacs that may need surgical drainage include cysts, skin infections (abscesses), and red lumps that develop from a ruptured cyst or a small abscess (boils).  Home instructions  Treatment: Keep wound clean and dry. Apply warm compresses to area throughout the day. It will continue to drain over the follow days.  For pain control you may take: 800mg  of ibuprofen (that is usually four 200mg  over the counter pills) up to 3 times a day (please take with food) and acetaminophen 975mg  (this is 3 normal strength, 325mg , over the counter pills) up to four times a day. Please do not take more than this. Do not drink alcohol or combine with other medications that have acetaminophen as an ingredient (Read the labels!).    Follow Up:  [facility] for wound recheck.  Return to emergency department for emergent changing or worsening symptoms.  Return instructions:  Return to the Emergency Department if you have: Fever You have more redness, swelling, or pain around your incision.  Your incision feels warm to touch Redness of the skin that moves away from the affected area, especially if it streaks away from the affected area  The area where the incision and drainage occurred becomes numb or it tingles. Any other emergent concerns  Additional Information: If you have recurrent abscesses, try both the following. Use a Qtip to apply an over-the-counter antibiotic to the inside of your abscess, twice a day for 5 days. Wash your body with over-the-counter Hibaclens once a day for one week and then once every two weeks. This can reduce the  amount of bacterial on your skin that causes boils and lead to fewer boils. If you continue to have multiple or recurrent boils, you should see a dermatologist (skin doctor).   Your vital signs today were: BP (!) 127/96 (BP Location: Right Arm)    Pulse 75    Temp 98.2 F (36.8 C) (Oral)    Resp 14    SpO2 100%  If your blood pressure (BP) was elevated above 135/85 this visit, please have this repeated by your doctor within one month. ---------------

## 2018-02-14 ENCOUNTER — Emergency Department (HOSPITAL_COMMUNITY)
Admission: EM | Admit: 2018-02-14 | Discharge: 2018-02-15 | Disposition: A | Discharge status: Home / Self Care | Payer: BLUE CROSS/BLUE SHIELD | Attending: Emergency Medicine | Admitting: Emergency Medicine

## 2018-02-14 DIAGNOSIS — F909 Attention-deficit hyperactivity disorder, unspecified type: Secondary | ICD-10-CM | POA: Diagnosis not present

## 2018-02-14 DIAGNOSIS — Z79899 Other long term (current) drug therapy: Secondary | ICD-10-CM | POA: Insufficient documentation

## 2018-02-14 DIAGNOSIS — F17210 Nicotine dependence, cigarettes, uncomplicated: Secondary | ICD-10-CM | POA: Insufficient documentation

## 2018-02-14 DIAGNOSIS — N451 Epididymitis: Secondary | ICD-10-CM | POA: Diagnosis not present

## 2018-02-14 DIAGNOSIS — N50812 Left testicular pain: Secondary | ICD-10-CM | POA: Diagnosis present

## 2018-02-14 NOTE — ED Triage Notes (Signed)
Pt presents with L testicle swelling x 5-6 days; pt states urinating normally, denies fevers; pt states pain began today; denies intercourse 2-3 wks ago

## 2018-02-15 ENCOUNTER — Emergency Department (HOSPITAL_COMMUNITY): Payer: BLUE CROSS/BLUE SHIELD

## 2018-02-15 DIAGNOSIS — N451 Epididymitis: Secondary | ICD-10-CM | POA: Diagnosis not present

## 2018-02-15 LAB — URINALYSIS, ROUTINE W REFLEX MICROSCOPIC
Bilirubin Urine: NEGATIVE
GLUCOSE, UA: NEGATIVE mg/dL
Ketones, ur: NEGATIVE mg/dL
NITRITE: NEGATIVE
PH: 6 (ref 5.0–8.0)
Protein, ur: 30 mg/dL — AB
SPECIFIC GRAVITY, URINE: 1.031 — AB (ref 1.005–1.030)
WBC, UA: >50 WBC/hpf — ABNORMAL HIGH (ref 0–5)

## 2018-02-15 LAB — RPR: RPR Ser Ql: NONREACTIVE

## 2018-02-15 LAB — GC/CHLAMYDIA PROBE AMP (~~LOC~~) NOT AT ARMC
Chlamydia: POSITIVE — AB
NEISSERIA GONORRHEA: NEGATIVE

## 2018-02-15 LAB — HIV ANTIBODY (ROUTINE TESTING W REFLEX): HIV Screen 4th Generation wRfx: NONREACTIVE

## 2018-02-15 MED ORDER — DOXYCYCLINE HYCLATE 100 MG PO CAPS: 100.0000 mg | ORAL_CAPSULE | Freq: Two times a day (BID) | ORAL | 0 refills | Status: DC

## 2018-02-15 MED ORDER — DOXYCYCLINE HYCLATE 100 MG PO TABS
100.0000 mg | ORAL_TABLET | Freq: Once | ORAL | Status: AC
  Administered 2018-02-15: 100 mg via ORAL
  Filled 2018-02-15: qty 1

## 2018-02-15 MED ORDER — STERILE WATER FOR INJECTION IJ SOLN
INTRAMUSCULAR | Status: AC
  Administered 2018-02-15: 0.9 mL
  Filled 2018-02-15: qty 10

## 2018-02-15 MED ORDER — CEFTRIAXONE SODIUM 250 MG IJ SOLR
250.0000 mg | Freq: Once | INTRAMUSCULAR | Status: AC
  Administered 2018-02-15: 250 mg via INTRAMUSCULAR
  Filled 2018-02-15: qty 250

## 2018-02-15 MED ORDER — AZITHROMYCIN 250 MG PO TABS
1000.0000 mg | ORAL_TABLET | Freq: Once | ORAL | Status: AC
  Administered 2018-02-15: 1000 mg via ORAL
  Filled 2018-02-15: qty 4

## 2018-02-15 NOTE — ED Provider Notes (Signed)
[facility] EMERGENCY DEPARTMENT Provider Note   CSN: [id number] Arrival date & time: 02/14/18  2320     History   Chief Complaint Chief Complaint  Patient presents with  . Testicle Pain  . Groin Swelling    HPI Connor Weber is a 27 y.o. male.  The history is provided by the patient.  He comes in complaining of left testicular pain and swelling for about the last week.  He states that he did not think much about it initially, but it seemed to be getting worse.  He denies fever or chills.  He denies dysuria or urethral discharge.  He rates pain at 6/10.  He has not done anything to treat it.  He states that he has not had any sexual contacts for the last several months.  Past Medical History:  Diagnosis Date  . ADHD (attention deficit hyperactivity disorder)   . Anxiety   . Bipolar 1 disorder (HCC)   . Depression   . Manic, depressive [facility])     Patient Active Problem List   Diagnosis Date Noted  . Lactose intolerance 08/09/2014  . Colitis 08/08/2014  . Abdominal pain 08/08/2014  . Bipolar disorder (HCC) 08/08/2014  . Tobacco abuse 08/08/2014  . Alcohol use 08/08/2014  . Marijuana abuse 08/08/2014  . Pelvic ascites 08/08/2014  . Abdominal pain, acute 03/18/2013  . Nausea & vomiting 03/18/2013  . Bradycardia 03/18/2013    Past Surgical History:  Procedure Laterality Date  . HAND TENDON SURGERY Right ~ 03/2014        Home Medications    Prior to Admission medications   Medication Sig Start Date End Date Taking? Authorizing Provider  ciprofloxacin (CIPRO) 500 MG tablet Take 1 tablet (500 mg total) by mouth 2 (two) times daily. 04/11/17   Rolland Porter, MD  ibuprofen (ADVIL,MOTRIN) 600 MG tablet Take 1 tablet (600 mg total) by mouth every 6 (six) hours as needed. 09/22/17   Maczis, Elmer Sow, PA-C  promethazine (PHENERGAN) 25 MG tablet Take 1 tablet (25 mg total) by mouth every 6 (six) hours as needed for nausea or vomiting. 04/11/17   Rolland Porter, MD  promethazine (PHENERGAN) 25 MG tablet Take 1 tablet (25 mg total) by mouth every 6 (six) hours as needed for nausea or vomiting. 04/11/17   Rolland Porter, MD    Family History No family history on file.  Social History Social History   Tobacco Use  . Smoking status: Current Every Day Smoker    Packs/day: 1.00    Years: 10.00    Pack years: 10.00    Types: Cigarettes  . Smokeless tobacco: Never Used  Substance Use Topics  . Alcohol use: Yes    Comment: 08/10/2014 "maybe one saturday q month I'll have 4-5 shots"  . Drug use: Yes    Types: Marijuana    Comment: 08/08/2014 "smoke marijuana qd"     Allergies   Patient has no known allergies.   Review of Systems Review of Systems  All other systems reviewed and are negative.    Physical Exam Updated Vital Signs BP 122/78   Pulse 71   Temp 99.4 F (37.4 C) (Oral)   Resp 14   Ht 5\' 7"  (1.702 m)   Wt 54.4 kg   SpO2 100%   BMI 18.79 kg/m   Physical Exam  Nursing note and vitals reviewed.  27 year old male, resting comfortably and in no acute distress. Vital signs are normal. Oxygen saturation  is 100%, which is normal. Head is normocephalic and atraumatic. PERRLA, EOMI. Oropharynx is clear. Neck is nontender and supple without adenopathy or JVD. Back is nontender and there is no CVA tenderness. Lungs are clear without rales, wheezes, or rhonchi. Chest is nontender. Heart has regular rate and rhythm without murmur. Abdomen is soft, flat, nontender without masses or hepatosplenomegaly and peristalsis is normoactive. Genitalia: Circumcised penis.  Testes descended.  Left testicle is indurated and enlarged and tender.  Bilateral inguinal adenopathy present. Extremities have no cyanosis or edema, full range of motion is present. Skin is warm and dry without rash. Neurologic: Mental status is normal, cranial nerves are intact, there are no motor or sensory deficits.  ED Treatments / Results  Labs (all labs  ordered are listed, but only abnormal results are displayed) Labs Reviewed  URINALYSIS, ROUTINE W REFLEX MICROSCOPIC - Abnormal; Notable for the following components:      Result Value   APPearance HAZY (*)    Specific Gravity, Urine 1.031 (*)    Hgb urine dipstick SMALL (*)    Protein, ur 30 (*)    Leukocytes, UA LARGE (*)    WBC, UA >50 (*)    Bacteria, UA RARE (*)    All other components within normal limits  RPR  HIV ANTIBODY (ROUTINE TESTING)  GC/CHLAMYDIA PROBE AMP ([place]) NOT AT [facility]    Radiology US Scrotum W/doppler  Result Date: 02/15/2018 CLINICAL DATA:  LEFT testicle swelling for 5-6 days. EXAM: SCROTAL ULTRASOUND DOPPLER ULTRASOUND OF THE TESTICLES TECHNIQUE: Complete ultrasound examination of the testicles, epididymis, and other scrotal structures was performed. Color and spectral Doppler ultrasound were also utilized to evaluate blood flow to the testicles. COMPARISON:  CT abdomen and pelvis April 11, 2017 FINDINGS: Right testicle Measurements: 3.6 x 1.9 x 2.6 cm. No mass or microlithiasis visualized. Left testicle Measurements: 2.9 x 2.2 x 2.6 cm. No mass or microlithiasis visualized. Right epididymis:  Normal in size and appearance. Left epididymis:  Enlarged, heterogeneous and hypervascular. Hydrocele:  None visualized. Varicocele: Mild LEFT varicoceles though this may be transient due to acute inflammation. Pulsed Doppler interrogation of both testes demonstrates normal low resistance arterial and venous waveforms bilaterally. IMPRESSION: 1. LEFT epididymitis without orchitis. Electronically Signed   By: Awilda Metro M.D.   On: 02/15/2018 00:55    Procedures Procedures   Medications Ordered in ED Medications  azithromycin (ZITHROMAX) tablet 1,000 mg (has no administration in time range)  cefTRIAXone (ROCEPHIN) injection 250 mg (has no administration in time range)  doxycycline (VIBRA-TABS) tablet 100 mg (has no administration in time range)     Initial  Impression / Assessment and Plan / ED Course  I have reviewed the triage vital signs and the nursing notes.  Pertinent labs & imaging results that were available during my care of the patient were reviewed by me and considered in my medical decision making (see chart for details).  Epididymitis which is most likely secondary to chlamydia.  Sexually transmitted infection panel has been sent.  Ultrasound confirms presence of epididymitis on the left testicle.  He is given an injection of ceftriaxone as well as oral azithromycin and doxycycline.  He is discharged with prescription for doxycycline.  Follow-up with PCP and/or urology as needed.  Final Clinical Impressions(s) / ED Diagnoses   Final diagnoses:  Epididymitis    ED Discharge Orders         Ordered    doxycycline (VIBRAMYCIN) 100 MG capsule  2 times daily  02/15/18 0351           Dione BoozeGlick, Melika Reder, MD 02/15/18 [id number]

## 2018-02-15 NOTE — ED Notes (Signed)
Reviewed d/c instructions with pt, who verbalized understanding and had no outstanding questions. Pt departed in NAD, refused use of wheelchair.   

## 2018-02-15 NOTE — Discharge Instructions (Signed)
Take ibuprofen or naproxen as needed for pain. You may also take acetaminophen for additional pain relief.  Wear a jock strap as needed.  Apply ice packs as needed.

## 2018-03-09 ENCOUNTER — Other Ambulatory Visit: Payer: Self-pay

## 2018-03-09 ENCOUNTER — Emergency Department (HOSPITAL_BASED_OUTPATIENT_CLINIC_OR_DEPARTMENT_OTHER)
Admission: EM | Admit: 2018-03-09 | Discharge: 2018-03-09 | Disposition: A | Discharge status: Home / Self Care | Payer: BLUE CROSS/BLUE SHIELD | Attending: Emergency Medicine | Admitting: Emergency Medicine

## 2018-03-09 ENCOUNTER — Encounter (HOSPITAL_BASED_OUTPATIENT_CLINIC_OR_DEPARTMENT_OTHER): Payer: Self-pay

## 2018-03-09 DIAGNOSIS — L02412 Cutaneous abscess of left axilla: Secondary | ICD-10-CM | POA: Diagnosis not present

## 2018-03-09 DIAGNOSIS — Z79899 Other long term (current) drug therapy: Secondary | ICD-10-CM | POA: Insufficient documentation

## 2018-03-09 DIAGNOSIS — Z23 Encounter for immunization: Secondary | ICD-10-CM | POA: Diagnosis not present

## 2018-03-09 DIAGNOSIS — F17210 Nicotine dependence, cigarettes, uncomplicated: Secondary | ICD-10-CM | POA: Diagnosis not present

## 2018-03-09 DIAGNOSIS — R2232 Localized swelling, mass and lump, left upper limb: Secondary | ICD-10-CM | POA: Diagnosis present

## 2018-03-09 MED ORDER — SULFAMETHOXAZOLE-TRIMETHOPRIM 800-160 MG PO TABS: 1.0000 | ORAL_TABLET | Freq: Two times a day (BID) | ORAL | 0 refills | Status: AC

## 2018-03-09 MED ORDER — LIDOCAINE-EPINEPHRINE (PF) 2 %-1:200000 IJ SOLN
10.0000 mL | Freq: Once | INTRAMUSCULAR | Status: DC
  Filled 2018-03-09 (×2): qty 10

## 2018-03-09 MED ORDER — LIDOCAINE 4 % EX CREA
TOPICAL_CREAM | CUTANEOUS | Status: AC
  Filled 2018-03-09: qty 5

## 2018-03-09 MED ORDER — SULFAMETHOXAZOLE-TRIMETHOPRIM 800-160 MG PO TABS
1.0000 | ORAL_TABLET | Freq: Once | ORAL | Status: AC
  Administered 2018-03-09: 1 via ORAL
  Filled 2018-03-09: qty 1

## 2018-03-09 MED ORDER — TETANUS-DIPHTH-ACELL PERTUSSIS 5-2.5-18.5 LF-MCG/0.5 IM SUSP
0.5000 mL | Freq: Once | INTRAMUSCULAR | Status: AC
  Administered 2018-03-09: 0.5 mL via INTRAMUSCULAR
  Filled 2018-03-09: qty 0.5

## 2018-03-09 MED ORDER — IBUPROFEN 400 MG PO TABS
600.0000 mg | ORAL_TABLET | Freq: Once | ORAL | Status: AC
  Administered 2018-03-09: 600 mg via ORAL
  Filled 2018-03-09: qty 1

## 2018-03-09 NOTE — ED Notes (Signed)
Fluctuant 5x5cm induration to left axilla. Punctum noted.

## 2018-03-09 NOTE — Discharge Instructions (Signed)
Take antibiotics as directed.  Flushed area with clean warm water in the shower 2-3 times daily to help promote drainage and healing.  Ibuprofen and Tylenol as well as warm compresses as needed for pain.  If you notice worsening redness, swelling, increasing pain or drainage, fevers or chills or any other new or concerning symptoms return to the emergency department for reevaluation otherwise please follow-up with your primary care doctor.

## 2018-03-09 NOTE — ED Triage Notes (Signed)
C/o left axilla abscess x 3 days-hx of same with I&D-NAD-steady gait

## 2018-03-09 NOTE — ED Provider Notes (Addendum)
[facility] EMERGENCY DEPARTMENT Provider Note   CSN: [id number] Arrival date & time: 03/09/18  2114     History   Chief Complaint Chief Complaint  Patient presents with  . Abscess    HPI Connor Weber is a 27 y.o. male.  Connor Weber is a 27 y.o. Male who is otherwise healthy, presents to the emergency department for evaluation of abscess to the left axilla.  He reports its been there for approximately 3 days, increasing in size with worsening redness and pain.  He has not had any drainage from the area.  Reports history of the same in the past requiring I&D.  No associated fevers or chills, no nausea or vomiting.  Pain is a constant ache worse with movement and palpation.  He has not taken anything for pain or tried anything to treat his symptoms prior to arrival.  He is not diabetic.  No other abscesses noted currently.     History reviewed. No pertinent past medical history.  There are no active problems to display for this patient.   Past Surgical History:  Procedure Laterality Date  . WRIST SURGERY          Home Medications    Prior to Admission medications   Medication Sig Start Date End Date Taking? Authorizing Provider  lisdexamfetamine (VYVANSE) 30 MG capsule Take 30 mg by mouth daily as needed. When angry to calm down    [provider]  methocarbamol (ROBAXIN) 500 MG tablet Take 1 tablet (500 mg total) by mouth 2 (two) times daily. 10/01/15   Fayrene Helper, PA-C  naproxen (NAPROSYN) 500 MG tablet Take 1 tablet (500 mg total) by mouth 2 (two) times daily. 10/01/15   Fayrene Helper, PA-C  PRESCRIPTION MEDICATION Take 1 tablet by mouth daily. For adhd and bipolar ( on 2 different meds)    [provider]  sulfamethoxazole-trimethoprim (BACTRIM DS,SEPTRA DS) 800-160 MG tablet Take 1 tablet by mouth 2 (two) times daily for 7 days. 03/09/18 03/16/18  Dartha Lodge, PA-C    Family History No family history on file.  Social History Social  History   Tobacco Use  . Smoking status: Current Every Day Smoker    Packs/day: 1.00    Types: Cigarettes  . Smokeless tobacco: Never Used  Substance Use Topics  . Alcohol use: Yes    Comment: occ  . Drug use: Yes    Types: Marijuana     Allergies   Patient has no known allergies.   Review of Systems Review of Systems  Constitutional: Negative for chills and fever.  Gastrointestinal: Negative for nausea and vomiting.  Skin: Positive for color change.       Abscess     Physical Exam Updated Vital Signs BP (!) 143/73 (BP Location: Left Arm)   Pulse 66   Temp 99.2 F (37.3 C) (Oral)   Resp 16   Ht 5\' 6"  (1.676 m)   Wt 60.8 kg   SpO2 100%   BMI 21.63 kg/m   Physical Exam  Constitutional: He appears well-developed and well-nourished. No distress.  HENT:  Head: Normocephalic and atraumatic.  Eyes: Right eye exhibits no discharge. Left eye exhibits no discharge.  Neck: Neck supple.  Pulmonary/Chest: Effort normal. No respiratory distress.  Musculoskeletal:  3 x 3 cm area of fluctuance with surrounding redness and induration in the left axilla, there is a large punctum noted with no obvious expressible drainage.  No erythematous streaking.  Neurological: He is  alert. Coordination normal.  Skin: Skin is warm and dry. Capillary refill takes less than 2 seconds. He is not diaphoretic.  Psychiatric: He has a normal mood and affect. His behavior is normal.  Nursing note and vitals reviewed.    ED Treatments / Results  Labs (all labs ordered are listed, but only abnormal results are displayed) Labs Reviewed - No data to display  EKG None  Radiology No results found.  Procedures .Marland Kitchen.Incision and Drainage Date/Time: 03/24/2018 10:11 AM Performed by: Dartha LodgeFord, Izabella Marcantel N, PA-C Authorized by: Dartha LodgeFord, Makana Rostad N, PA-C   Consent:    Consent obtained:  Verbal   Consent given by:  Patient   Risks discussed:  Incomplete drainage, infection, damage to other organs, bleeding  and pain   Alternatives discussed:  No treatment Location:    Type:  Abscess   Size:  3 x 3 cm   Location:  Upper extremity   Upper extremity location: L axilla. Pre-procedure details:    Skin preparation:  Betadine Anesthesia (see MAR for exact dosages):    Anesthesia method:  Local infiltration   Local anesthetic:  Lidocaine 2% WITH epi Procedure type:    Complexity:  Simple Procedure details:    Incision types:  Single straight   Incision depth:  Dermal   Scalpel blade:  11   Wound management:  Probed and deloculated and irrigated with saline   Drainage:  Purulent   Drainage amount:  Copious   Wound treatment:  Wound left open   Packing materials:  None Post-procedure details:    Patient tolerance of procedure:  Tolerated well, no immediate complications   (including critical care time)  Medications Ordered in ED Medications  lidocaine (LMX) 4 % cream (has no administration in time range)  lidocaine-EPINEPHrine (XYLOCAINE W/EPI) 2 %-1:200000 (PF) injection 10 mL (has no administration in time range)  ibuprofen (ADVIL,MOTRIN) tablet 600 mg (600 mg Oral Given 03/09/18 2144)  Tdap (BOOSTRIX) injection 0.5 mL (0.5 mLs Intramuscular Given 03/09/18 2229)  sulfamethoxazole-trimethoprim (BACTRIM DS,SEPTRA DS) 800-160 MG per tablet 1 tablet (1 tablet Oral Given 03/09/18 2314)     Initial Impression / Assessment and Plan / ED Course  I have reviewed the triage vital signs and the nursing notes.  Pertinent labs & imaging results that were available during my care of the patient were reviewed by me and considered in my medical decision making (see chart for details).  Patient with skin abscess amenable to incision and drainage in the left axilla, hx of the same.  Patient tolerated I&D well.  Abscess was not large enough to warrant packing or drain,  wound recheck in 2 days. Encouraged home warm soaks and flushing.  Moderate surrounding cellulitis, will treat with Bactrim.  Return  precautions and wound care discussed.  Stable for discharge home.  Final Clinical Impressions(s) / ED Diagnoses   Final diagnoses:  Abscess of left axilla    ED Discharge Orders         Ordered    sulfamethoxazole-trimethoprim (BACTRIM DS,SEPTRA DS) 800-160 MG tablet  2 times daily     03/09/18 2306           Dartha LodgeFord, Bridey Brookover N, PA-C 03/10/18 0032    Maia PlanLong, Joshua G, MD 03/10/18 1013    Dartha LodgeFord, Yashas Camilli N, [place]PA-C 03/24/18 1012    Maia PlanLong, Joshua G, MD 03/27/18 [phone]

## 2019-05-31 ENCOUNTER — Emergency Department (HOSPITAL_COMMUNITY)
Admission: EM | Admit: 2019-05-31 | Discharge: 2019-06-01 | Disposition: A | Discharge status: Other Acute Inpatient Hospital | Payer: BC Managed Care – PPO | Attending: Emergency Medicine | Admitting: Emergency Medicine

## 2019-05-31 ENCOUNTER — Other Ambulatory Visit: Payer: Self-pay

## 2019-05-31 ENCOUNTER — Encounter (HOSPITAL_COMMUNITY): Payer: Self-pay | Admitting: Emergency Medicine

## 2019-05-31 DIAGNOSIS — Z20828 Contact with and (suspected) exposure to other viral communicable diseases: Secondary | ICD-10-CM | POA: Insufficient documentation

## 2019-05-31 DIAGNOSIS — F17210 Nicotine dependence, cigarettes, uncomplicated: Secondary | ICD-10-CM | POA: Diagnosis not present

## 2019-05-31 DIAGNOSIS — F1220 Cannabis dependence, uncomplicated: Secondary | ICD-10-CM | POA: Insufficient documentation

## 2019-05-31 DIAGNOSIS — R45851 Suicidal ideations: Secondary | ICD-10-CM | POA: Insufficient documentation

## 2019-05-31 DIAGNOSIS — F319 Bipolar disorder, unspecified: Secondary | ICD-10-CM | POA: Diagnosis not present

## 2019-05-31 DIAGNOSIS — R4689 Other symptoms and signs involving appearance and behavior: Secondary | ICD-10-CM

## 2019-05-31 DIAGNOSIS — R440 Auditory hallucinations: Secondary | ICD-10-CM | POA: Diagnosis present

## 2019-05-31 DIAGNOSIS — Z79899 Other long term (current) drug therapy: Secondary | ICD-10-CM | POA: Diagnosis not present

## 2019-05-31 LAB — CBC WITH DIFFERENTIAL/PLATELET
Abs Immature Granulocytes: 0.03 10*3/uL (ref 0.00–0.07)
Basophils Absolute: 0.1 10*3/uL (ref 0.0–0.1)
Basophils Relative: 1 %
Eosinophils Absolute: 0.2 10*3/uL (ref 0.0–0.5)
Eosinophils Relative: 3 %
HCT: 44.3 % (ref 39.0–52.0)
Hemoglobin: 14.2 g/dL (ref 13.0–17.0)
Immature Granulocytes: 1 %
Lymphocytes Relative: 16 %
Lymphs Abs: 1.1 10*3/uL (ref 0.7–4.0)
MCH: 28.6 pg (ref 26.0–34.0)
MCHC: 32.1 g/dL (ref 30.0–36.0)
MCV: 89.3 fL (ref 80.0–100.0)
Monocytes Absolute: 0.6 10*3/uL (ref 0.1–1.0)
Monocytes Relative: 9 %
Neutro Abs: 4.7 10*3/uL (ref 1.7–7.7)
Neutrophils Relative %: 70 %
Platelets: 251 10*3/uL (ref 150–400)
RBC: 4.96 MIL/uL (ref 4.22–5.81)
RDW: 13.3 % (ref 11.5–15.5)
WBC: 6.7 10*3/uL (ref 4.0–10.5)
nRBC: 0 % (ref 0.0–0.2)

## 2019-05-31 LAB — COMPREHENSIVE METABOLIC PANEL
ALT: 23 U/L (ref 0–44)
AST: 31 U/L (ref 15–41)
Albumin: 4.7 g/dL (ref 3.5–5.0)
Alkaline Phosphatase: 59 U/L (ref 38–126)
Anion gap: 13 (ref 5–15)
BUN: 16 mg/dL (ref 6–20)
CO2: 25 mmol/L (ref 22–32)
Calcium: 10.1 mg/dL (ref 8.9–10.3)
Chloride: 101 mmol/L (ref 98–111)
Creatinine, Ser: 1.14 mg/dL (ref 0.61–1.24)
GFR calc Af Amer: >60 mL/min (ref >60)
GFR calc non Af Amer: >60 mL/min (ref >60)
Glucose, Bld: 149 mg/dL — ABNORMAL HIGH (ref 70–99)
Potassium: 3.8 mmol/L (ref 3.5–5.1)
Sodium: 139 mmol/L (ref 135–145)
Total Bilirubin: 1 mg/dL (ref 0.3–1.2)
Total Protein: 7.9 g/dL (ref 6.5–8.1)

## 2019-05-31 LAB — RAPID URINE DRUG SCREEN, HOSP PERFORMED
Amphetamines: NOT DETECTED
Barbiturates: NOT DETECTED
Benzodiazepines: POSITIVE — AB
Cocaine: NOT DETECTED
Opiates: NOT DETECTED
Tetrahydrocannabinol: POSITIVE — AB

## 2019-05-31 MED ORDER — OLANZAPINE 2.5 MG PO TABS
2.5000 mg | ORAL_TABLET | Freq: Every day | ORAL | Status: DC
  Administered 2019-06-01: 2.5 mg via ORAL
  Filled 2019-05-31: qty 1

## 2019-05-31 MED ORDER — LORAZEPAM 0.5 MG PO TABS
0.5000 mg | ORAL_TABLET | Freq: Three times a day (TID) | ORAL | Status: DC
  Administered 2019-05-31 – 2019-06-01 (×2): 0.5 mg via ORAL
  Filled 2019-05-31 (×2): qty 1

## 2019-05-31 MED ORDER — NICOTINE 21 MG/24HR TD PT24
21.0000 mg | MEDICATED_PATCH | Freq: Every day | TRANSDERMAL | Status: DC
  Administered 2019-05-31 – 2019-06-01 (×2): 21 mg via TRANSDERMAL
  Filled 2019-05-31 (×2): qty 1

## 2019-05-31 MED ORDER — TRAZODONE HCL 100 MG PO TABS: 100.0000 mg | ORAL_TABLET | Freq: Every evening | ORAL | Status: DC | PRN

## 2019-05-31 MED ORDER — OLANZAPINE 10 MG PO TABS
10.0000 mg | ORAL_TABLET | Freq: Every day | ORAL | Status: DC
  Administered 2019-05-31: 10 mg via ORAL
  Filled 2019-05-31: qty 1

## 2019-05-31 NOTE — ED Notes (Signed)
Patient resting at this time.
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
## 2019-05-31 NOTE — ED Provider Notes (Addendum)
[facility]-EMERGENCY DEPT Provider Note   CSN: [id number] Arrival date & time: 05/31/19  1653     History   Chief Complaint Chief Complaint  Patient presents with  . Medical Clearance    HPI Connor Dupesrik R Weber is a 28 y.o. male.     Patient is a 28 year old male with a history of bipolar disease, depression, ADHD and anxiety who [place] [facility]  Patient has been there for 3 days because he states things in his life are very stressful and he was thinking that it would be better if he was not around.  He was concerned that if he got angry enough he would kill himself.  [facility] reports that he had disorganized speech, auditory hallucinations that were fleeting and has been displaying more aggressive behavior towards staff so sent him here for further evaluation.  Patient was IVC prior to transport.  Patient states that he has not been on medicine since he was a teenager but they had started some medicine in the last few days.  He is not sure if it is helping or not.  He does use marijuana daily but denies regular alcohol use.  He also smokes cigarettes but denies wanting a nicotine patch.  He denies any Covid-like symptoms today.  The history is provided by the patient and the police.    Past Medical History:  Diagnosis Date  . ADHD (attention deficit hyperactivity disorder)   . Anxiety   . Bipolar 1 disorder (HCC)   . Depression   . Manic, depressive [facility](HCC)     Patient Active Problem List   Diagnosis Date Noted  . Lactose intolerance 08/09/2014  . Colitis 08/08/2014  . Abdominal pain 08/08/2014  . Bipolar disorder (HCC) 08/08/2014  . Tobacco abuse 08/08/2014  . Alcohol use 08/08/2014  . Marijuana abuse 08/08/2014  . Pelvic ascites 08/08/2014  . Abdominal pain, acute 03/18/2013  . Nausea & vomiting 03/18/2013  . Bradycardia 03/18/2013    Past Surgical History:  Procedure Laterality Date  . HAND TENDON SURGERY Right ~ 03/2014         Home Medications    Prior to Admission medications   Medication Sig Start Date End Date Taking? Authorizing Provider  doxycycline (VIBRAMYCIN) 100 MG capsule Take 1 capsule (100 mg total) by mouth 2 (two) times daily. 02/15/18   Dione BoozeGlick, David, MD    Family History No family history on file.  Social History Social History   Tobacco Use  . Smoking status: Current Every Day Smoker    Packs/day: 1.00    Years: 10.00    Pack years: 10.00    Types: Cigarettes  . Smokeless tobacco: Never Used  Substance Use Topics  . Alcohol use: Yes    Comment: 08/10/2014 "maybe one saturday q month I'll have 4-5 shots"  . Drug use: Yes    Types: Marijuana    Comment: 08/08/2014 "smoke marijuana qd"     Allergies   Patient has no known allergies.   Review of Systems Review of Systems  All other systems reviewed and are negative.    Physical Exam Updated Vital Signs There were no vitals taken for this visit.  Physical Exam Vitals signs and nursing note reviewed.  Constitutional:      General: He is not in acute distress.    Appearance: Normal appearance. He is well-developed and normal weight.  HENT:     Head: Normocephalic and atraumatic.  Eyes:  Conjunctiva/sclera: Conjunctivae normal.     Pupils: Pupils are equal, round, and reactive to light.  Neck:     Musculoskeletal: Normal range of motion and neck supple.  Cardiovascular:     Rate and Rhythm: Normal rate and regular rhythm.     Heart sounds: No murmur.  Pulmonary:     Effort: Pulmonary effort is normal. No respiratory distress.     Breath sounds: Normal breath sounds. No wheezing or rales.  Abdominal:     General: There is no distension.     Palpations: Abdomen is soft.     Tenderness: There is no abdominal tenderness. There is no guarding or rebound.  Musculoskeletal: Normal range of motion.        General: No tenderness.  Skin:    General: Skin is warm and dry.     Findings: No erythema or rash.   Neurological:     General: No focal deficit present.     Mental Status: He is alert and oriented to person, place, and time. Mental status is at baseline.  Psychiatric:        Mood and Affect: Mood normal.        Speech: Speech normal.        Behavior: Behavior normal.        Thought Content: Thought content includes suicidal ideation.     Comments: At this time patient is calm and cooperative.  He denies hallucinations at this time.  He states that however he is concerned if he got too mad he could potentially hurt himself but he does not want to hurt himself.      ED Treatments / Results  Labs (all labs ordered are listed, but only abnormal results are displayed) Labs Reviewed  RAPID URINE DRUG SCREEN, HOSP PERFORMED - Abnormal; Notable for the following components:      Result Value   Benzodiazepines POSITIVE (*)    Tetrahydrocannabinol POSITIVE (*)    All other components within normal limits  COMPREHENSIVE METABOLIC PANEL - Abnormal; Notable for the following components:   Glucose, Bld 149 (*)    All other components within normal limits  SARS CORONAVIRUS 2 (TAT 6-24 HRS)  CBC WITH DIFFERENTIAL/PLATELET    EKG None  Radiology No results found.  Procedures Procedures (including critical care time)  Medications Ordered in ED Medications - No data to display   Initial Impression / Assessment and Plan / ED Course  I have reviewed the triage vital signs and the nursing notes.  Pertinent labs & imaging results that were available during my care of the patient were reviewed by me and considered in my medical decision making (see chart for details).        28 year old male presenting with police [facility] under [facility]C commitment.  Patient with disorganized thoughts at [facility] as well as hallucinations and aggressive behavior.  Currently patient is calm and cooperative and not displaying any aggressive behavior.  Currently his thoughts seem to be fairly organized.  He  has recently been started on medication in the last 3 days.  No medical complaints at this time.  Exam is otherwise normal.  First exam completed.  TTS to evaluate.  Labs pending.  Labs without acute findings.  Positive for benzos and marijuana on UDS.  Seen by TTS and they feel he will most likely be able to go tomorrow.  Given patient just arrived here was under IVC by another facility did not feel comfortable resending the IVC until patient  is evaluated by psychiatrist.  Final Clinical Impressions(s) / ED Diagnoses   Final diagnoses:  Aggressive behavior  Disorganized behavior    ED Discharge Orders    None       Gwyneth Sprout, MD 05/31/19 1735    Gwyneth Sprout, MD 05/31/19 2014

## 2019-05-31 NOTE — ED Notes (Signed)
Pt presents when one pt belonging bag in locker **27

## 2019-05-31 NOTE — ED Notes (Signed)
Patient provided sandwich and gingerale. Denies any other needs at this time.
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
## 2019-05-31 NOTE — ED Triage Notes (Signed)
Pt was at [facility] for 3 days and they had difficulty with him.  He did not like their rules, the TV time, did not get along with a security guard. So the placed him under IVC and [employer] brought him here.

## 2019-06-01 ENCOUNTER — Encounter (HOSPITAL_COMMUNITY): Payer: Self-pay | Admitting: Emergency Medicine

## 2019-06-01 ENCOUNTER — Observation Stay (HOSPITAL_COMMUNITY)
Admission: AD | Admit: 2019-06-01 | Discharge: 2019-06-02 | Disposition: A | Discharge status: Home / Self Care | Payer: BC Managed Care – PPO | Source: Intra-hospital | Attending: Psychiatry | Admitting: Psychiatry

## 2019-06-01 DIAGNOSIS — F1910 Other psychoactive substance abuse, uncomplicated: Secondary | ICD-10-CM | POA: Diagnosis present

## 2019-06-01 DIAGNOSIS — R45851 Suicidal ideations: Secondary | ICD-10-CM | POA: Diagnosis not present

## 2019-06-01 DIAGNOSIS — F419 Anxiety disorder, unspecified: Secondary | ICD-10-CM | POA: Diagnosis not present

## 2019-06-01 DIAGNOSIS — Z79899 Other long term (current) drug therapy: Secondary | ICD-10-CM | POA: Diagnosis not present

## 2019-06-01 DIAGNOSIS — F1210 Cannabis abuse, uncomplicated: Secondary | ICD-10-CM | POA: Diagnosis present

## 2019-06-01 DIAGNOSIS — F17210 Nicotine dependence, cigarettes, uncomplicated: Secondary | ICD-10-CM | POA: Diagnosis not present

## 2019-06-01 DIAGNOSIS — F319 Bipolar disorder, unspecified: Secondary | ICD-10-CM | POA: Diagnosis not present

## 2019-06-01 LAB — SARS CORONAVIRUS 2 (TAT 6-24 HRS): SARS Coronavirus 2: NEGATIVE

## 2019-06-01 MED ORDER — CARBAMAZEPINE 200 MG PO TABS
200.0000 mg | ORAL_TABLET | Freq: Two times a day (BID) | ORAL | Status: DC
  Administered 2019-06-01 – 2019-06-02 (×2): 200 mg via ORAL
  Filled 2019-06-01: qty 1

## 2019-06-01 MED ORDER — HYDROXYZINE HCL 25 MG PO TABS
25.0000 mg | ORAL_TABLET | Freq: Three times a day (TID) | ORAL | Status: DC | PRN
  Administered 2019-06-01: 25 mg via ORAL
  Filled 2019-06-01: qty 1

## 2019-06-01 MED ORDER — MAGNESIUM HYDROXIDE 400 MG/5ML PO SUSP: 30.0000 mL | Freq: Every day | ORAL | Status: DC | PRN

## 2019-06-01 MED ORDER — ALUM & MAG HYDROXIDE-SIMETH 200-200-20 MG/5ML PO SUSP: 30.0000 mL | ORAL | Status: DC | PRN

## 2019-06-01 MED ORDER — ACETAMINOPHEN 325 MG PO TABS: 650.0000 mg | ORAL_TABLET | Freq: Four times a day (QID) | ORAL | Status: DC | PRN

## 2019-06-01 NOTE — ED Notes (Signed)
Pt to [facility] OBS unit per provider. Pt calm, cooperative, no s/s of distress. Pt DC information given [employer] for facility .Belongins given to [employer]. Pt ambulatory off the unit, escorted by [employer]. Transported by [employer].

## 2019-06-01 NOTE — Progress Notes (Addendum)
Patient ID: NOSSON WENDER, male   DOB: [birth year], 28 y.o.   MRN: [id number]  D: Pt alert and oriented during [facility] admission process. Pt denies SI/HI, A/VH, and any pain. Pt is irritable but cooperative.  HPI DELLIS VOGHT is a 28 y.o. male. Patient is a 28 year old male with a history of bipolar disease, depression, ADHD and anxiety who is presenting today with police from [place].  Patient has been there for 3 days because he states things in his life are very stressful and he was thinking that it would be better if he was not around.  He was concerned that if he got angry enough he would kill himself.  [facility] reports that he had disorganized speech, auditory hallucinations that were fleeting and has been displaying more aggressive behavior towards staff so sent him here for further evaluation.  Patient was IVC prior to transport.  Patient states that he has not been on medicine since he was a teenager but they had started some medicine in the last few days.  He is not sure if it is helping or not.  He does use marijuana daily but denies regular alcohol use.  He also smokes cigarettes but denies wanting a nicotine patch.  He denies any Covid-like symptoms today.  A: Education, support, reassurance, and encouragement provided, q15 minute safety checks initiated. Pt's belongings in locker #57.    R: Pt denies any concerns at this time, and verbally contracts for safety. Pt ambulating on the unit with no issues. Pt remains safe on the unit.

## 2019-06-01 NOTE — Plan of Care (Addendum)
[facility]ation Crisis Plan  Reason for Crisis Plan:  Crisis Stabilization and Medication Management   Plan of Care:  Referral for Telepsychiatry/Psychiatric Consult  Family Support:      Current Living Environment:  Living Arrangements: Alone  Insurance:   Hospital Account    Name Acct ID Class Status Primary Coverage   Connor Weber, Connor Weber [id number] [place] PPO        Guarantor Account (for Hospital Account [id number])    Name Relation to Pt Service Area Active? Acct Type   Connor Weber Self [facility] Yes Behavioral Health   Address Phone       [address]  [place], [place] [address] [phone])          Coverage Information (for Hospital Account [id number])    F/O Payor/Plan Precert #   [facility] SHIELD/BCBS COMM PPO    Subscriber Subscriber #   [place], Behar [id number]   Address Phone   [address] [place], [place] [address] [phone]      Legal Guardian:   self  Primary Care Provider:  Nolene Ebbs, MD  Current Outpatient Providers:  Connor Weber  Psychiatrist: n/a     Counselor/Therapist:   n/a  Compliant with Medications:  No  Additional Information:   Connor Weber 12/5/20205:32 PM

## 2019-06-01 NOTE — Progress Notes (Signed)
This patient has been accepted to the observation unit at [facility].   Bed: 205  RN Call for Report: [id number]  Patient may arrive at any time. This patient is currently under IVC and will require sheriff's transportation.  Stephanie Acre, [place] Social Worker [phone]

## 2019-06-01 NOTE — Progress Notes (Signed)
Patient ID: MARKICE TORBERT, male   DOB: Nov 07, 1990, 28 y.o.   MRN: [id number] [place] NOVEL CORONAVIRUS (COVID-19) DAILY CHECK-OFF SYMPTOMS - answer yes or no to each - every day NO YES  Have you had a fever in the past 24 hours?  . Fever (Temp > 37.80C / 100F) X   Have you had any of these symptoms in the past 24 hours? . New Cough .  Sore Throat  .  Shortness of Breath .  Difficulty Breathing .  Unexplained Body Aches   X   Have you had any one of these symptoms in the past 24 hours not related to allergies?   . Runny Nose .  Nasal Congestion .  Sneezing   X   If you have had runny nose, nasal congestion, sneezing in the past 24 hours, has it worsened?  X   EXPOSURES - check yes or no X   Have you traveled outside the state in the past 14 days?  X   Have you been in contact with someone with a confirmed diagnosis of COVID-19 or PUI in the past 14 days without wearing appropriate PPE?  X   Have you been living in the same home as a person with confirmed diagnosis of COVID-19 or a PUI (household contact)?    X   Have you been diagnosed with COVID-19?    X              What to do next: Answered NO to all: Answered YES to anything:   Proceed with unit schedule Follow the [facility] Inpatient Flowsheet.
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
## 2019-06-01 NOTE — BH Assessment (Signed)
[place] Assessment Progress Note Per Akintayo MD patient will be observed and monitored. Patient has been accepted to [facility] Observational Unit 205 once COVID has resulted.

## 2019-06-02 DIAGNOSIS — F1210 Cannabis abuse, uncomplicated: Secondary | ICD-10-CM | POA: Diagnosis not present

## 2019-06-02 MED ORDER — CARBAMAZEPINE 200 MG PO TABS: 200.0000 mg | ORAL_TABLET | Freq: Two times a day (BID) | ORAL | 0 refills | Status: DC

## 2019-06-02 NOTE — Progress Notes (Addendum)
Patient ID: Connor Weber, male   DOB: 1990/11/16, 28 y.o.   MRN: [id number] Patient educated on discharge instructions, verbalizes understanding, left the hospital with all of his discharge instructions and personal belongings. Pt denied SI/HI/AVH and contracts to safety outside of the hospital prior to discharge.

## 2019-06-02 NOTE — Progress Notes (Signed)
Patient ID: Connor Weber, male   DOB: [birth year], 28 y.o.   MRN: [id number] Pt noted lying in bed at the beginning asleep; easy to arouse by verbal stimuli. Pt alert and orient x 3. No apparent distress noted or complaints voiced. Pt denies SI, HI, and AVH. Pt contracted for safety. Will continue to monitor q 15 mins for safey.

## 2019-06-02 NOTE — BHH Suicide Risk Assessment (Cosign Needed)
Suicide Risk Assessment  Discharge Assessment   [facility] Discharge Suicide Risk Assessment   Principal Problem: Polysubstance abuse [facility]) Discharge Diagnoses: Principal Problem:   Polysubstance abuse ([place]) Active Problems:   Marijuana abuse   Total Time spent with patient: 30 minutes  Musculoskeletal: Strength & Muscle Tone: within normal limits Gait & Station: normal Patient leans: N/A  Psychiatric Specialty Exam:   Blood pressure (!) 137/96, pulse 90, temperature 97.7 F (36.5 C), temperature source Oral, resp. rate 18, SpO2 100 %.There is no height or weight on file to calculate BMI.  General Appearance: Casual  Eye Contact::  Good  Speech:  Clear and Coherent and Normal Rate  Volume:  Normal  Mood:  Euthymic  Affect:  Appropriate and Congruent  Thought Process:  Coherent, Goal Directed and Descriptions of Associations: Intact  Orientation:  Full (Time, Place, and Person)  Thought Content:  WDL and Logical  Suicidal Thoughts:  No  Homicidal Thoughts:  No  Memory:  Immediate;   Good Recent;   Good Remote;   Good  Judgement:  Good  Insight:  Good  Psychomotor Activity:  Normal  Concentration:  Good  Recall:  Good  Fund of Knowledge:Good  Language: Good  Akathisia:  No  Handed:  Right  AIMS (if indicated):     Assets:  Communication Skills Desire for Improvement Financial Resources/Insurance [place] Talents/Skills Transportation Vocational/Educational  Sleep:     Cognition: WNL  ADL's:  Intact   Mental Status Per Nursing Assessment::   On Admission:  NA  Demographic Factors:  Male  Loss Factors: NA  Historical Factors: NA  Risk Reduction Factors:   Sense of responsibility to family, Religious beliefs about death, Employed, Living with another person, especially a relative, Positive social support, Positive therapeutic relationship and Positive coping skills or problem solving skills  Continued Clinical  Symptoms:  Alcohol/Substance Abuse/Dependencies  Cognitive Features That Contribute To Risk:  None    Suicide Risk:  Minimal: No identifiable suicidal ideation.  Patients presenting with no risk factors but with morbid ruminations; may be classified as minimal risk based on the severity of the depressive symptoms    Plan Of Care/Follow-up recommendations:  Other:  Follow-up with outpatient psychiatry, resources given  Emmaline Kluver, FNP 06/02/2019, 11:21 AM

## 2019-06-02 NOTE — Discharge Summary (Addendum)
Physician Discharge Summary Note  Patient:  Connor Weber is an 28 y.o., male MRN:  [id number] DOB:  Aug 19, 1990 Patient phone:  [phone] (home)  Patient address:   [address]  [place] [address],  Total Time spent with patient: 30 minutes  Date of Admission:  06/01/2019 Date of Discharge: 06/02/2019 Reason for Admission: Patient admitted after involuntarily committed [facility] staff.  Patient reported history of suicidal ideation, denied suicidal thoughts on admission  Principal Problem: Polysubstance abuse [facility]) Discharge Diagnoses: Principal Problem:   Polysubstance abuse ([place]) Active Problems:   Marijuana abuse   Past Psychiatric History: Polysubstance abuse, bipolar disorder  Past Medical History:  Past Medical History:  Diagnosis Date  . ADHD (attention deficit hyperactivity disorder)   . Anxiety   . Bipolar 1 disorder ([place])   . Depression   . Manic, depressive ([place])     Past Surgical History:  Procedure Laterality Date  . HAND TENDON SURGERY Right ~ 03/2014   Family History: History reviewed. No pertinent family history. Family Psychiatric  History: Father-schizophrenia, grandfather-schizophrenia Social History:  Social History   Substance and Sexual Activity  Alcohol Use Yes   Comment: 08/10/2014 "maybe one saturday q month I'll have 4-5 shots"     Social History   Substance and Sexual Activity  Drug Use Yes  . Types: Marijuana   Comment: 08/08/2014 "smoke marijuana qd"    Social History   Socioeconomic History  . Marital status: Single    Spouse name: Not on file  . Number of children: Not on file  . Years of education: Not on file  . Highest education level: Not on file  Occupational History  . Not on file  Social Needs  . Financial resource strain: Not on file  . Food insecurity    Worry: Not on file    Inability: Not on file  . Transportation needs    Medical: Not on file    Non-medical: Not on file  Tobacco Use  . Smoking  status: Current Every Day Smoker    Packs/day: 1.00    Years: 10.00    Pack years: 10.00    Types: Cigarettes  . Smokeless tobacco: Never Used  Substance and Sexual Activity  . Alcohol use: Yes    Comment: 08/10/2014 "maybe one saturday q month I'll have 4-5 shots"  . Drug use: Yes    Types: Marijuana    Comment: 08/08/2014 "smoke marijuana qd"  . Sexual activity: Not Currently  Lifestyle  . Physical activity    Days per week: Not on file    Minutes per session: Not on file  . Stress: Not on file  Relationships  . Social Herbalist on phone: Not on file    Gets together: Not on file    Attends religious service: Not on file    Active member of club or organization: Not on file    Attends meetings of clubs or organizations: Not on file    Relationship status: Not on file  Other Topics Concern  . Not on file  Social History Narrative  . Not on file    Hospital Course: Patient admitted to observation.  Patient cooperative and appropriate during observation stay.  Tegretol 200 mg by mouth twice daily initiated for mood lability.  Patient states "I feel much better today."  Patient denies suicidal and homicidal ideations.  Patient denies hallucinations.  Patient does not currently have outpatient psychiatrist, given resources. Patient seen along with Dr.  Ammarie Matsuura who agrees with plan for discharge with follow-up outpatient.  Physical Findings: AIMS: Facial and Oral Movements Muscles of Facial Expression: None, normal Lips and Perioral Area: None, normal Jaw: None, normal Tongue: None, normal,Extremity Movements Upper (arms, wrists, hands, fingers): None, normal Lower (legs, knees, ankles, toes): None, normal, Trunk Movements Neck, shoulders, hips: None, normal, Overall Severity Severity of abnormal movements (highest score from questions above): None, normal Incapacitation due to abnormal movements: None, normal Patient's awareness of abnormal movements (rate only  patient's report): No Awareness, Dental Status Current problems with teeth and/or dentures?: No Does patient usually wear dentures?: No  CIWA:  CIWA-Ar Total: 0 COWS:  COWS Total Score: 0  Musculoskeletal: Strength & Muscle Tone: within normal limits Gait & Station: normal Patient leans: N/A  Psychiatric Specialty Exam: Physical Exam  Nursing note and vitals reviewed. Constitutional: He is oriented to person, place, and time. He appears well-developed.  HENT:  Head: Normocephalic.  Cardiovascular: Normal rate.  Respiratory: Effort normal.  Neurological: He is alert and oriented to person, place, and time.  Psychiatric: He has a normal mood and affect. His behavior is normal. Judgment and thought content normal.    Review of Systems  Constitutional: Negative.   HENT: Negative.   Eyes: Negative.   Respiratory: Negative.   Cardiovascular: Negative.   Gastrointestinal: Negative.   Genitourinary: Negative.   Musculoskeletal: Negative.   Skin: Negative.   Neurological: Negative.   Endo/Heme/Allergies: Negative.   Psychiatric/Behavioral: Positive for substance abuse.    Blood pressure (!) 137/96, pulse 90, temperature 97.7 F (36.5 C), temperature source Oral, resp. rate 18, SpO2 100 %.There is no height or weight on file to calculate BMI.  General Appearance: Casual  Eye Contact:  Good  Speech:  Clear and Coherent and Normal Rate  Volume:  Normal  Mood:  Euthymic  Affect:  Appropriate and Congruent  Thought Process:  Coherent, Goal Directed and Descriptions of Associations: Intact  Orientation:  Full (Time, Place, and Person)  Thought Content:  WDL and Logical  Suicidal Thoughts:  No  Homicidal Thoughts:  No  Memory:  Immediate;   Good Recent;   Good Remote;   Good  Judgement:  Good    Insight:  Good  Psychomotor Activity:  Normal and Increased  Concentration:  Concentration: Good and Attention Span: Good  Recall:  Good  Fund of Knowledge:  Good  Language:  Good   Akathisia:  No  Handed:  Right  AIMS (if indicated):     Assets:  Communication Skills Desire for Improvement Financial Resources/Insurance Housing Leisure Time Physical Health Resilience Social Support Talents/Skills Transportation Vocational/Educational  ADL's:  Intact  Cognition:  WNL  Sleep:           Has this patient used any form of tobacco in the last 30 days? (Cigarettes, Smokeless Tobacco, Cigars, and/or Pipes) Yes, Yes, A prescription for an FDA-approved tobacco cessation medication was offered at discharge and the patient refused  Blood Alcohol level:  Lab Results  Component Value Date   [facility] <5 08/08/2014   ETH <11 03/24/2014    Metabolic Disorder Labs:  No results found for: HGBA1C, MPG No results found for: PROLACTIN Lab Results  Component Value Date   CHOL 84 08/08/2014   TRIG 19 08/08/2014   HDL 45 08/08/2014   CHOLHDL 1.9 08/08/2014   VLDL 4 08/08/2014   LDLCALC 35 08/08/2014    See Psychiatric Specialty Exam and Suicide Risk Assessment completed by Attending Physician prior to discharge.  Discharge destination:  Home  Is patient on multiple antipsychotic therapies at discharge:  No   Has Patient had three or more failed trials of antipsychotic monotherapy by history:  No  Recommended Plan for Multiple Antipsychotic Therapies: NA   Allergies as of 06/02/2019   No Known Allergies     Medication List    TAKE these medications     Indication  carbamazepine 200 MG tablet Commonly known as: TEGRETOL Take 1 tablet (200 mg total) by mouth 2 (two) times daily.  Indication: Manic-Depression   doxycycline 100 MG capsule Commonly known as: VIBRAMYCIN Take 1 capsule (100 mg total) by mouth 2 (two) times daily.  Indication: Common Acne   LORazepam 0.5 MG tablet Commonly known as: ATIVAN Take 0.5 mg by mouth every 8 (eight) hours.  Indication: Feeling Anxious   OLANZapine 2.5 MG tablet Commonly known as: ZYPREXA Take 2.5 mg by mouth  every morning.  Indication: MIXED BIPOLAR AFFECTIVE DISORDER   OLANZapine 10 MG tablet Commonly known as: ZYPREXA Take 10 mg by mouth at bedtime.  Indication: MIXED BIPOLAR AFFECTIVE DISORDER        Follow-up recommendations:  Other:  Follow-up with outpatient resources  Comments: Discharge  Signed: Patrcia Dollyina L Tate, FNP 06/02/2019, 11:24 AM  Patient seen face-to-face for psychiatric evaluation, chart reviewed and case discussed with the physician extender and developed treatment plan. Reviewed the information documented and agree with the treatment plan. Thedore MinsMojeed Chinita Schimpf, MD

## 2019-12-15 ENCOUNTER — Other Ambulatory Visit: Payer: Self-pay

## 2019-12-15 ENCOUNTER — Emergency Department (HOSPITAL_COMMUNITY)
Admission: EM | Admit: 2019-12-15 | Discharge: 2019-12-15 | Disposition: A | Discharge status: Home / Self Care | Payer: BC Managed Care – PPO | Attending: Emergency Medicine | Admitting: Emergency Medicine

## 2019-12-15 DIAGNOSIS — Z8659 Personal history of other mental and behavioral disorders: Secondary | ICD-10-CM | POA: Diagnosis not present

## 2019-12-15 DIAGNOSIS — F17210 Nicotine dependence, cigarettes, uncomplicated: Secondary | ICD-10-CM | POA: Insufficient documentation

## 2019-12-15 DIAGNOSIS — R369 Urethral discharge, unspecified: Secondary | ICD-10-CM | POA: Diagnosis not present

## 2019-12-15 LAB — HIV ANTIBODY (ROUTINE TESTING W REFLEX): HIV Screen 4th Generation wRfx: NONREACTIVE

## 2019-12-15 MED ORDER — DOXYCYCLINE HYCLATE 100 MG PO CAPS: 100.0000 mg | ORAL_CAPSULE | Freq: Two times a day (BID) | ORAL | 0 refills | Status: DC

## 2019-12-15 MED ORDER — STERILE WATER FOR INJECTION IJ SOLN
INTRAMUSCULAR | Status: AC
  Administered 2019-12-15: 2.1 mL
  Filled 2019-12-15: qty 10

## 2019-12-15 MED ORDER — CEFTRIAXONE SODIUM 1 G IJ SOLR
500.0000 mg | Freq: Once | INTRAMUSCULAR | Status: AC
  Administered 2019-12-15: 500 mg via INTRAMUSCULAR
  Filled 2019-12-15: qty 10

## 2019-12-15 NOTE — ED Provider Notes (Signed)
[facility] DEPT Provider Note   CSN: [id number] Arrival date & time: 12/15/19  9242     History Chief Complaint  Patient presents with  . Exposure to STD    Connor Weber is a 29 y.o. male.  Patient is a 29 year old male who presents with discharge from his penis.  He reports a 2-day history of yellow-green discharge from his penis.  There is no abdominal pain.  No testicular pain.  No fevers.  No nausea or vomiting.  He is sexually active.  He previously had an STD when he was around 29 years old which was gonorrhea.  He says he usually uses condoms but not every time.        Past Medical History:  Diagnosis Date  . ADHD (attention deficit hyperactivity disorder)   . Anxiety   . Bipolar 1 disorder ([place])   . Depression   . Manic, depressive [facility])     Patient Active Problem List   Diagnosis Date Noted  . Polysubstance abuse ([place]) 06/01/2019  . Lactose intolerance 08/09/2014  . Colitis 08/08/2014  . Abdominal pain 08/08/2014  . Bipolar disorder ([place]) 08/08/2014  . Tobacco abuse 08/08/2014  . Alcohol use 08/08/2014  . Marijuana abuse 08/08/2014  . Pelvic ascites 08/08/2014  . Abdominal pain, acute 03/18/2013  . Nausea & vomiting 03/18/2013  . Bradycardia 03/18/2013    Past Surgical History:  Procedure Laterality Date  . HAND TENDON SURGERY Right ~ 03/2014       No family history on file.  Social History   Tobacco Use  . Smoking status: Current Every Day Smoker    Packs/day: 1.00    Years: 10.00    Pack years: 10.00    Types: Cigarettes  . Smokeless tobacco: Never Used  Substance Use Topics  . Alcohol use: Yes    Comment: 08/10/2014 "maybe one saturday q month I'll have 4-5 shots"  . Drug use: Yes    Types: Marijuana    Comment: 08/08/2014 "smoke marijuana qd"    Home Medications Prior to Admission medications   Medication Sig Start Date End Date Taking? Authorizing Provider  carbamazepine (TEGRETOL) 200 MG tablet  Take 1 tablet (200 mg total) by mouth 2 (two) times daily. 06/02/19   Emmaline Kluver, FNP  doxycycline (VIBRAMYCIN) 100 MG capsule Take 1 capsule (100 mg total) by mouth 2 (two) times daily. Patient not taking: Reported on 68/08/4194 08/19/95   Delora Fuel, MD  LORazepam (ATIVAN) 0.5 MG tablet Take 0.5 mg by mouth every 8 (eight) hours.    [provider]  OLANZapine (ZYPREXA) 10 MG tablet Take 10 mg by mouth at bedtime.    [provider]  OLANZapine (ZYPREXA) 2.5 MG tablet Take 2.5 mg by mouth every morning.    [provider]    Allergies    Patient has no known allergies.  Review of Systems   Review of Systems  Constitutional: Negative for chills, diaphoresis, fatigue and fever.  HENT: Negative for congestion, rhinorrhea and sneezing.   Eyes: Negative.   Respiratory: Negative for cough, chest tightness and shortness of breath.   Cardiovascular: Negative for chest pain and leg swelling.  Gastrointestinal: Negative for abdominal pain, blood in stool, diarrhea, nausea and vomiting.  Genitourinary: Positive for discharge. Negative for difficulty urinating, flank pain, frequency and hematuria.  Musculoskeletal: Negative for arthralgias and back pain.  Skin: Negative for rash.  Neurological: Negative for dizziness, speech difficulty, weakness, numbness and headaches.  Physical Exam Updated Vital Signs BP 138/90 (BP Location: Left Arm)   Pulse 61   Temp 97.8 F (36.6 C) (Oral)   Resp 18   Ht 5\' 6"  (1.676 m)   Wt 57.6 kg   SpO2 100%   BMI 20.50 kg/m   Physical Exam Constitutional:      Appearance: He is well-developed.  HENT:     Head: Normocephalic and atraumatic.     Mouth/Throat:     Mouth: Mucous membranes are moist.  Cardiovascular:     Rate and Rhythm: Normal rate.  Pulmonary:     Effort: Pulmonary effort is normal. No respiratory distress.     Breath sounds: No wheezing or rales.  Chest:     Chest wall: No tenderness.  Abdominal:      General: Bowel sounds are normal.     Palpations: Abdomen is soft.     Tenderness: There is no abdominal tenderness. There is no guarding or rebound.  Genitourinary:    Comments: Normal-appearing external male genitalia, no testicular tenderness, no masses or external skin lesions are noted. Musculoskeletal:        General: Normal range of motion.     Cervical back: Normal range of motion and neck supple.  Lymphadenopathy:     Cervical: No cervical adenopathy.  Skin:    General: Skin is warm and dry.     Findings: No rash.  Neurological:     Mental Status: He is alert and oriented to person, place, and time.     ED Results / Procedures / Treatments   Labs (all labs ordered are listed, but only abnormal results are displayed) Labs Reviewed  RPR  HIV ANTIBODY (ROUTINE TESTING W REFLEX)  GC/CHLAMYDIA PROBE AMP ([place]) NOT AT [facility]    EKG None  Radiology No results found.  Procedures Procedures (including critical care time)  Medications Ordered in ED Medications  cefTRIAXone (ROCEPHIN) injection 500 mg (has no administration in time range)    ED Course  I have reviewed the triage vital signs and the nursing notes.  Pertinent labs & imaging results that were available during my care of the patient were reviewed by me and considered in my medical decision making (see chart for details).    MDM Rules/Calculators/A&P                          Patient is a 29 year old male who presents with penile discharge.  STD testing was performed.  He was given STD precautions.  He was treated with Rocephin and will be given a prescription for doxycycline. Final Clinical Impression(s) / ED Diagnoses Final diagnoses:  Penile discharge    Rx / DC Orders ED Discharge Orders    None       37, MD 12/15/19 1004

## 2019-12-15 NOTE — ED Notes (Signed)
PT Provided urine specimen cup. °

## 2019-12-15 NOTE — Discharge Instructions (Addendum)
You can find your test results in your MyChart account.  If you have any concerns, you can call here for further information.  You need to refrain from having intercourse until your results come back.  If you have any positive results, your partner also needs to get tested and treated.  Return to emergency room if you have any worsening symptoms.

## 2019-12-15 NOTE — ED Triage Notes (Signed)
Per patient he may have STD or UTI - patient reports pain with urination and thick yellow/green discharge coming from penis since yesterday. Patient reports two sexual partners and no std preventatives.

## 2019-12-16 LAB — RPR: RPR Ser Ql: NONREACTIVE

## 2019-12-20 ENCOUNTER — Encounter (HOSPITAL_COMMUNITY): Payer: Self-pay | Admitting: Emergency Medicine

## 2020-10-14 ENCOUNTER — Emergency Department (HOSPITAL_COMMUNITY)
Admission: EM | Admit: 2020-10-14 | Discharge: 2020-10-14 | Disposition: A | Discharge status: Home / Self Care | Payer: BC Managed Care – PPO | Attending: Emergency Medicine | Admitting: Emergency Medicine

## 2020-10-14 ENCOUNTER — Emergency Department (HOSPITAL_COMMUNITY): Payer: BC Managed Care – PPO

## 2020-10-14 ENCOUNTER — Encounter (HOSPITAL_COMMUNITY): Payer: Self-pay

## 2020-10-14 DIAGNOSIS — W2209XA Striking against other stationary object, initial encounter: Secondary | ICD-10-CM | POA: Diagnosis not present

## 2020-10-14 DIAGNOSIS — F17210 Nicotine dependence, cigarettes, uncomplicated: Secondary | ICD-10-CM | POA: Diagnosis not present

## 2020-10-14 DIAGNOSIS — M25572 Pain in left ankle and joints of left foot: Secondary | ICD-10-CM | POA: Insufficient documentation

## 2020-10-14 DIAGNOSIS — W19XXXA Unspecified fall, initial encounter: Secondary | ICD-10-CM

## 2020-10-14 MED ORDER — NAPROXEN 500 MG PO TABS: 500.0000 mg | ORAL_TABLET | Freq: Two times a day (BID) | ORAL | 0 refills | Status: DC
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
## 2020-10-14 NOTE — Discharge Instructions (Addendum)
Take the anti-inflammatory.  Return for new or worsening symptoms
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
## 2021-04-04 ENCOUNTER — Encounter (HOSPITAL_COMMUNITY): Payer: Self-pay | Admitting: *Deleted

## 2021-04-04 ENCOUNTER — Ambulatory Visit (HOSPITAL_COMMUNITY)
Admission: EM | Admit: 2021-04-04 | Discharge: 2021-04-04 | Disposition: A | Discharge status: Home / Self Care | Payer: BC Managed Care – PPO | Attending: Physician Assistant | Admitting: Physician Assistant

## 2021-04-04 ENCOUNTER — Other Ambulatory Visit: Payer: Self-pay

## 2021-04-04 DIAGNOSIS — F17210 Nicotine dependence, cigarettes, uncomplicated: Secondary | ICD-10-CM | POA: Insufficient documentation

## 2021-04-04 DIAGNOSIS — R051 Acute cough: Secondary | ICD-10-CM

## 2021-04-04 DIAGNOSIS — R0789 Other chest pain: Secondary | ICD-10-CM | POA: Diagnosis not present

## 2021-04-04 DIAGNOSIS — Z20822 Contact with and (suspected) exposure to covid-19: Secondary | ICD-10-CM | POA: Diagnosis not present

## 2021-04-04 DIAGNOSIS — R062 Wheezing: Secondary | ICD-10-CM | POA: Diagnosis not present

## 2021-04-04 DIAGNOSIS — J069 Acute upper respiratory infection, unspecified: Secondary | ICD-10-CM | POA: Diagnosis not present

## 2021-04-04 DIAGNOSIS — R509 Fever, unspecified: Secondary | ICD-10-CM | POA: Insufficient documentation

## 2021-04-04 LAB — SARS CORONAVIRUS 2 (TAT 6-24 HRS): SARS Coronavirus 2: NEGATIVE

## 2021-04-04 LAB — POC INFLUENZA A AND B ANTIGEN (URGENT CARE ONLY)
INFLUENZA A ANTIGEN, POC: NEGATIVE
INFLUENZA B ANTIGEN, POC: NEGATIVE

## 2021-04-04 MED ORDER — ALBUTEROL SULFATE HFA 108 (90 BASE) MCG/ACT IN AERS
2.0000 | INHALATION_SPRAY | Freq: Once | RESPIRATORY_TRACT | Status: AC
  Administered 2021-04-04: 2 via RESPIRATORY_TRACT

## 2021-04-04 MED ORDER — ALBUTEROL SULFATE HFA 108 (90 BASE) MCG/ACT IN AERS
INHALATION_SPRAY | RESPIRATORY_TRACT | Status: AC
  Filled 2021-04-04: qty 6.7

## 2021-04-04 NOTE — Discharge Instructions (Addendum)
Your flu test was negative.  We will contact you if your COVID test is positive.  Please use albuterol every 4-6 hours as needed for wheezing and shortness of breath symptoms.  Use over-the-counter medicines such as Tylenol, Mucinex, Flonase for symptom relief.  Rest and drink plenty of fluid.  If you have any worsening symptoms such as high fever, shortness of breath, chest pain, nausea, vomiting you need to be reevaluated immediately.

## 2021-04-04 NOTE — ED Triage Notes (Signed)
Pt reports Sx's started Friday. Pt has cough ,chills,fever and congestion. Pt also has a bumpt on RT ear.

## 2021-04-04 NOTE — ED Provider Notes (Signed)
[facility]    CSN: [id number] Arrival date & time: 04/04/21  1046      History   Chief Complaint Chief Complaint  Patient presents with   Cough   Chills   Nasal Congestion   Fever    HPI Connor Weber is a 30 y.o. male.   Patient presents today with a 2-day history of URI symptoms.  Reports chills, subjective fever, nasal congestion, cough, decreased appetite, fatigue, chest tightness with coughing.  Denies any chest pain, shortness of breath, nausea, vomiting, body aches, headache.  He has not tried any over-the-counter medication for symptom management.  Denies any known sick contacts but does work at [employer] exposed to many people.  He is a current everyday smoker but denies any history of asthma or COPD.  He has not had COVID in the past.  Denies previous vaccination for COVID-19.  Reports he is having difficulty with daily duties as result of symptoms.   Past Medical History:  Diagnosis Date   ADHD (attention deficit hyperactivity disorder)    Anxiety    Bipolar 1 disorder (HCC)    Depression    Manic, depressive (HCC)     Patient Active Problem List   Diagnosis Date Noted   Polysubstance abuse (HCC) 06/01/2019   Lactose intolerance 08/09/2014   Colitis 08/08/2014   Abdominal pain 08/08/2014   Bipolar disorder (HCC) 08/08/2014   Tobacco abuse 08/08/2014   Alcohol use 08/08/2014   Marijuana abuse 08/08/2014   Pelvic ascites 08/08/2014   Abdominal pain, acute 03/18/2013   Nausea & vomiting 03/18/2013   Bradycardia 03/18/2013    Past Surgical History:  Procedure Laterality Date   HAND TENDON SURGERY Right ~ 03/2014   WRIST SURGERY         Home Medications    Prior to Admission medications   Medication Sig Start Date End Date Taking? Authorizing Provider  carbamazepine (TEGRETOL) 200 MG tablet Take 1 tablet (200 mg total) by mouth 2 (two) times daily. 06/02/19   Lenard Lance, FNP  doxycycline (VIBRAMYCIN) 100 MG capsule Take 1 capsule (100 mg  total) by mouth 2 (two) times daily. 12/15/19   Rolan Bucco, MD  lisdexamfetamine (VYVANSE) 30 MG capsule Take 30 mg by mouth daily as needed. When angry to calm down    [provider]  LORazepam (ATIVAN) 0.5 MG tablet Take 0.5 mg by mouth every 8 (eight) hours.    [provider]  methocarbamol (ROBAXIN) 500 MG tablet Take 1 tablet (500 mg total) by mouth 2 (two) times daily. 10/01/15   Fayrene Helper, PA-C  naproxen (NAPROSYN) 500 MG tablet Take 1 tablet (500 mg total) by mouth 2 (two) times daily. 10/14/20   Henderly, Britni A, PA-C  OLANZapine (ZYPREXA) 10 MG tablet Take 10 mg by mouth at bedtime.    [provider]  OLANZapine (ZYPREXA) 2.5 MG tablet Take 2.5 mg by mouth every morning.    [provider]  PRESCRIPTION MEDICATION Take 1 tablet by mouth daily. For adhd and bipolar ( on 2 different meds)    [provider]    Family History History reviewed. No pertinent family history.  Social History Social History   Tobacco Use   Smoking status: Every Day    Packs/day: 1.00    Years: 10.00    Pack years: 10.00    Types: Cigarettes   Smokeless tobacco: Never  Substance Use Topics   Alcohol use: Yes    Comment: occ  Drug use: Yes    Types: Marijuana    Comment: 08/08/2014 "smoke marijuana qd"     Allergies   Patient has no known allergies.   Review of Systems Review of Systems  Constitutional:  Positive for activity change, chills, fatigue and fever (subjective). Negative for appetite change.  HENT:  Positive for congestion and sore throat. Negative for sinus pressure and sneezing.   Respiratory:  Positive for cough and chest tightness. Negative for shortness of breath.   Cardiovascular:  Negative for chest pain.  Gastrointestinal:  Negative for abdominal pain, diarrhea, nausea and vomiting.  Musculoskeletal:  Negative for arthralgias and myalgias.  Neurological:  Negative for dizziness, light-headedness and headaches.     Physical Exam Triage Vital Signs ED Triage Vitals  Enc Vitals Group     BP 04/04/21 1236 (!) 137/96     Pulse Rate 04/04/21 1236 73     Resp 04/04/21 1236 18     Temp 04/04/21 1236 98 F (36.7 C)     Temp src --      SpO2 04/04/21 1236 98 %     Weight --      Height --      Head Circumference --      Peak Flow --      Pain Score 04/04/21 1234 0     Pain Loc --      Pain Edu? --      Excl. in GC? --    No data found.  Updated Vital Signs BP (!) 137/96   Pulse 73   Temp 98 F (36.7 C)   Resp 18   SpO2 98%   Visual Acuity Right Eye Distance:   Left Eye Distance:   Bilateral Distance:    Right Eye Near:   Left Eye Near:    Bilateral Near:     Physical Exam Vitals reviewed.  Constitutional:      General: He is awake.     Appearance: Normal appearance. He is well-developed. He is not ill-appearing.     Comments: Very pleasant male appears stated age in no acute distress sitting comfortably in exam room  HENT:     Head: Normocephalic and atraumatic.     Right Ear: Tympanic membrane, ear canal and external ear normal. Tympanic membrane is not erythematous or bulging.     Left Ear: Tympanic membrane, ear canal and external ear normal. Tympanic membrane is not erythematous or bulging.     Nose: Nose normal.     Mouth/Throat:     Pharynx: Uvula midline. Posterior oropharyngeal erythema present. No oropharyngeal exudate.     Tonsils: No tonsillar exudate or tonsillar abscesses.  Cardiovascular:     Rate and Rhythm: Normal rate and regular rhythm.     Heart sounds: Normal heart sounds, S1 normal and S2 normal. No murmur heard. Pulmonary:     Effort: Pulmonary effort is normal. No accessory muscle usage or respiratory distress.     Breath sounds: No stridor. Examination of the right-lower field reveals wheezing. Examination of the left-lower field reveals wheezing. Wheezing present. No rhonchi or rales.     Comments: Wheezing improved with in office  albuterol Abdominal:     Palpations: Abdomen is soft.     Tenderness: There is no abdominal tenderness.  Neurological:     Mental Status: He is alert.  Psychiatric:        Behavior: Behavior is cooperative.     UC Treatments / Results  Labs (all labs  ordered are listed, but only abnormal results are displayed) Labs Reviewed  SARS CORONAVIRUS 2 (TAT 6-24 HRS)  POC INFLUENZA A AND B ANTIGEN (URGENT CARE ONLY)    EKG   Radiology No results found.  Procedures Procedures (including critical care time)  Medications Ordered in UC Medications  albuterol (VENTOLIN HFA) 108 (90 Base) MCG/ACT inhaler 2 puff (2 puffs Inhalation Given 04/04/21 1332)    Initial Impression / Assessment and Plan / UC Course  I have reviewed the triage vital signs and the nursing notes.  Pertinent labs & imaging results that were available during my care of the patient were reviewed by me and considered in my medical decision making (see chart for details).      Vital signs and physical exam reassuring today; no indication for emergent evaluation or imaging.  Patient was given albuterol in clinic with improvement of symptoms.  Discussed likely viral etiology.  Flu testing was negative.  COVID test is pending.  Patient was given work excuse note with current CDC return to work guidelines based on COVID test result.  No evidence of acute infection that warrant initiation of antibiotics.  Recommended over-the-counter medications including Tylenol, Mucinex, Flonase for symptom relief.  Encouraged him to rest and drink plenty of fluid.  Discussed alarm symptoms that warrant emergent evaluation to which she expressed understanding.  Strict return precautions given to which patient expressed understanding.  Final Clinical Impressions(s) / UC Diagnoses   Final diagnoses:  Upper respiratory tract infection, unspecified type  Acute cough  Wheezing     Discharge Instructions      Your flu test was negative.   We will contact you if your COVID test is positive.  Please use albuterol every 4-6 hours as needed for wheezing and shortness of breath symptoms.  Use over-the-counter medicines such as Tylenol, Mucinex, Flonase for symptom relief.  Rest and drink plenty of fluid.  If you have any worsening symptoms such as high fever, shortness of breath, chest pain, nausea, vomiting you need to be reevaluated immediately.     ED Prescriptions   None    PDMP not reviewed this encounter.   Jeani Hawking, PA-C 04/04/21 1402

## 2021-04-16 ENCOUNTER — Emergency Department (HOSPITAL_COMMUNITY)
Admission: EM | Admit: 2021-04-16 | Discharge: 2021-04-16 | Disposition: A | Discharge status: Home / Self Care | Payer: BC Managed Care – PPO | Attending: Emergency Medicine | Admitting: Emergency Medicine

## 2021-04-16 ENCOUNTER — Emergency Department (HOSPITAL_COMMUNITY): Payer: BC Managed Care – PPO

## 2021-04-16 DIAGNOSIS — Z79899 Other long term (current) drug therapy: Secondary | ICD-10-CM | POA: Diagnosis not present

## 2021-04-16 DIAGNOSIS — W230XXA Caught, crushed, jammed, or pinched between moving objects, initial encounter: Secondary | ICD-10-CM | POA: Diagnosis not present

## 2021-04-16 DIAGNOSIS — R200 Anesthesia of skin: Secondary | ICD-10-CM | POA: Diagnosis not present

## 2021-04-16 DIAGNOSIS — S99921A Unspecified injury of right foot, initial encounter: Secondary | ICD-10-CM | POA: Diagnosis present

## 2021-04-16 DIAGNOSIS — S91311A Laceration without foreign body, right foot, initial encounter: Secondary | ICD-10-CM

## 2021-04-16 DIAGNOSIS — F17210 Nicotine dependence, cigarettes, uncomplicated: Secondary | ICD-10-CM | POA: Diagnosis not present

## 2021-04-16 LAB — CBC
HCT: 37.5 % — ABNORMAL LOW (ref 39.0–52.0)
Hemoglobin: 11.7 g/dL — ABNORMAL LOW (ref 13.0–17.0)
MCH: 27.9 pg (ref 26.0–34.0)
MCHC: 31.2 g/dL (ref 30.0–36.0)
MCV: 89.5 fL (ref 80.0–100.0)
Platelets: 360 10*3/uL (ref 150–400)
RBC: 4.19 MIL/uL — ABNORMAL LOW (ref 4.22–5.81)
RDW: 13.1 % (ref 11.5–15.5)
WBC: 7.6 10*3/uL (ref 4.0–10.5)
nRBC: 0 % (ref 0.0–0.2)

## 2021-04-16 LAB — BASIC METABOLIC PANEL
Anion gap: 11 (ref 5–15)
BUN: 17 mg/dL (ref 6–20)
CO2: 20 mmol/L — ABNORMAL LOW (ref 22–32)
Calcium: 9.4 mg/dL (ref 8.9–10.3)
Chloride: 105 mmol/L (ref 98–111)
Creatinine, Ser: 1 mg/dL (ref 0.61–1.24)
GFR, Estimated: >60 mL/min (ref >60)
Glucose, Bld: 86 mg/dL (ref 70–99)
Potassium: 4.2 mmol/L (ref 3.5–5.1)
Sodium: 136 mmol/L (ref 135–145)

## 2021-04-16 MED ORDER — HYDROCODONE-ACETAMINOPHEN 5-325 MG PO TABS
1.0000 | ORAL_TABLET | Freq: Once | ORAL | Status: AC
  Administered 2021-04-16: 1 via ORAL
  Filled 2021-04-16: qty 1

## 2021-04-16 MED ORDER — ONDANSETRON 4 MG PO TBDP
4.0000 mg | ORAL_TABLET | Freq: Once | ORAL | Status: AC
  Administered 2021-04-16: 4 mg via ORAL
  Filled 2021-04-16: qty 1

## 2021-04-16 MED ORDER — NAPROXEN 375 MG PO TABS: 375.0000 mg | ORAL_TABLET | Freq: Two times a day (BID) | ORAL | 0 refills | Status: DC

## 2021-04-16 MED ORDER — CEPHALEXIN 500 MG PO CAPS: ORAL_CAPSULE | ORAL | 0 refills | Status: DC

## 2021-04-16 MED ORDER — LIDOCAINE HCL (PF) 1 % IJ SOLN
30.0000 mL | Freq: Once | INTRAMUSCULAR | Status: AC
  Administered 2021-04-16: 30 mL
  Filled 2021-04-16: qty 30
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
## 2021-04-16 NOTE — ED Notes (Signed)
Patient d/c instructions reviewed with the patient. Patient verbalized understanding. Patient d/c.

## 2021-04-16 NOTE — Progress Notes (Signed)
Orthopedic Tech Progress Note Patient Details:  Connor Weber 11-24-1990 [id number]  Ortho Devices Type of Ortho Device: CAM walker, Crutches Ortho Device/Splint Location: RLE Ortho Device/Splint Interventions: Ordered, Application   Post Interventions Patient Tolerated: Well Instructions Provided: Care of device  Woodie Trusty A Adellyn Capek 04/16/2021, 2:38 PM
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
## 2021-04-16 NOTE — ED Triage Notes (Signed)
Given of Fentanyl by nasal

## 2021-04-16 NOTE — Discharge Instructions (Signed)
WOUND CARE  Do not bear weight on the foot for the next 2-3 days. You may toe-touch ambulate and then progress to full weight bearing after 5 days.   Please have your stitches/staples removed in 10 days or sooner if you have concerns. You may do this at any available urgent care or at your primary care doctor's office.  Keep area clean and dry for 24-48 hours. Do not remove bandage, if applied.  After 24 hours, remove bandage and wash wound gently with mild soap and warm water. Reapply a new bandage after cleaning wound, if directed.  Continue daily cleansing with soap and water until stitches/staples are removed.  Do not apply any ointments or creams to the wound while stitches/staples are in place, as this may cause delayed healing.  Seek medical careif you experience any of the following signs of infection: Swelling, redness, pus drainage, streaking, fever >101.0 F  Seek care if you experience excessive bleeding that does not stop after 15-20 minutes of constant, firm pressure.
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
## 2021-04-16 NOTE — ED Notes (Signed)
Ortho notified for CAM boot and crutches.
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
## 2021-04-26 ENCOUNTER — Ambulatory Visit (HOSPITAL_COMMUNITY)
Admission: EM | Admit: 2021-04-26 | Discharge: 2021-04-26 | Disposition: A | Discharge status: Home / Self Care | Payer: BC Managed Care – PPO | Attending: Student | Admitting: Student

## 2021-04-26 ENCOUNTER — Other Ambulatory Visit: Payer: Self-pay

## 2021-04-26 ENCOUNTER — Encounter (HOSPITAL_COMMUNITY): Payer: Self-pay | Admitting: Emergency Medicine

## 2021-04-26 DIAGNOSIS — S91311A Laceration without foreign body, right foot, initial encounter: Secondary | ICD-10-CM | POA: Diagnosis not present

## 2021-04-26 DIAGNOSIS — L089 Local infection of the skin and subcutaneous tissue, unspecified: Secondary | ICD-10-CM

## 2021-04-26 MED ORDER — BACITRACIN ZINC 500 UNIT/GM EX OINT
TOPICAL_OINTMENT | CUTANEOUS | Status: AC
  Filled 2021-04-26: qty 3.6

## 2021-04-26 MED ORDER — DOXYCYCLINE HYCLATE 100 MG PO CAPS: 100.0000 mg | ORAL_CAPSULE | Freq: Two times a day (BID) | ORAL | 0 refills | Status: AC

## 2021-04-26 NOTE — ED Triage Notes (Signed)
Pt presents for suture removal. Pt concerned for infection.

## 2021-04-26 NOTE — Discharge Instructions (Addendum)
-  Doxycycline twice daily for 7 days.  Make sure to wear sunscreen while spending time outside while on this medication as it can increase your chance of sunburn. You can take this medication with food if you have a sensitive stomach. ?-Wash your wound with gentle soap and water 1-2 times daily.  Let air dry or gently pat. You can follow with over-the-counter neosporin ointment (or similar). Keep wrapped during the day or when you're doing something that could get it dirty (working, yardwork, cooking, etc). Avoid cleansing with hydrogen peroxide or alcohol!! ?-Seek additional medical attention if the wound is getting worse instead of better- redness increasing in size, pain getting worse, new/worsening discharge, new fevers/chills, etc. ? ?
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
## 2022-01-09 ENCOUNTER — Ambulatory Visit (HOSPITAL_COMMUNITY)
Admission: EM | Admit: 2022-01-09 | Discharge: 2022-01-09 | Disposition: A | Discharge status: Home / Self Care | Payer: PRIVATE HEALTH INSURANCE | Attending: Internal Medicine | Admitting: Internal Medicine

## 2022-01-09 ENCOUNTER — Encounter (HOSPITAL_COMMUNITY): Payer: Self-pay | Admitting: Emergency Medicine

## 2022-01-09 ENCOUNTER — Other Ambulatory Visit: Payer: Self-pay

## 2022-01-09 DIAGNOSIS — K0889 Other specified disorders of teeth and supporting structures: Secondary | ICD-10-CM | POA: Diagnosis not present

## 2022-01-09 DIAGNOSIS — K047 Periapical abscess without sinus: Secondary | ICD-10-CM | POA: Diagnosis not present

## 2022-01-09 MED ORDER — AMOXICILLIN-POT CLAVULANATE 875-125 MG PO TABS: 1.0000 | ORAL_TABLET | Freq: Two times a day (BID) | ORAL | 0 refills | Status: DC

## 2022-01-09 MED ORDER — IBUPROFEN 600 MG PO TABS: 600.0000 mg | ORAL_TABLET | Freq: Four times a day (QID) | ORAL | 0 refills | Status: DC | PRN

## 2022-01-09 NOTE — ED Provider Notes (Signed)
[facility]    CSN: [id number] Arrival date & time: 01/09/22  1004      History   Chief Complaint Chief Complaint  Patient presents with   Dental Pain    HPI Connor Weber is a 31 y.o. male.   Patient presents with right lower dental pain that started about 9 days ago.  Patient reports that pain is worsened over the past few days.  Denies fever, body aches, chills, purulent drainage from the mouth.  Denies trauma to the mouth.  Patient has not taken any medications for the pain.  He has not contacted a dentist.   Dental Pain   Past Medical History:  Diagnosis Date   ADHD (attention deficit hyperactivity disorder)    Anxiety    Bipolar 1 disorder (HCC)    Depression    Manic, depressive (HCC)     Patient Active Problem List   Diagnosis Date Noted   Polysubstance abuse (HCC) 06/01/2019   Lactose intolerance 08/09/2014   Colitis 08/08/2014   Abdominal pain 08/08/2014   Bipolar disorder (HCC) 08/08/2014   Tobacco abuse 08/08/2014   Alcohol use 08/08/2014   Marijuana abuse 08/08/2014   Pelvic ascites 08/08/2014   Abdominal pain, acute 03/18/2013   Nausea & vomiting 03/18/2013   Bradycardia 03/18/2013    Past Surgical History:  Procedure Laterality Date   HAND TENDON SURGERY Right ~ 03/2014   WRIST SURGERY         Home Medications    Prior to Admission medications   Medication Sig Start Date End Date Taking? Authorizing Provider  amoxicillin-clavulanate (AUGMENTIN) 875-125 MG tablet Take 1 tablet by mouth every 12 (twelve) hours. 01/09/22  Yes , Rolly Salter E, FNP  ibuprofen (ADVIL) 600 MG tablet Take 1 tablet (600 mg total) by mouth every 6 (six) hours as needed for mild pain. 01/09/22  Yes , Acie Fredrickson, FNP  carbamazepine (TEGRETOL) 200 MG tablet Take 1 tablet (200 mg total) by mouth 2 (two) times daily. Patient not taking: Reported on 01/09/2022 06/02/19   Lenard Lance, FNP  lisdexamfetamine (VYVANSE) 30 MG capsule Take 30 mg by mouth daily as  needed. When angry to calm down Patient not taking: Reported on 01/09/2022    [provider]  LORazepam (ATIVAN) 0.5 MG tablet Take 0.5 mg by mouth every 8 (eight) hours. Patient not taking: Reported on 01/09/2022    [provider]  methocarbamol (ROBAXIN) 500 MG tablet Take 1 tablet (500 mg total) by mouth 2 (two) times daily. Patient not taking: Reported on 01/09/2022 10/01/15   Fayrene Helper, PA-C  naproxen (NAPROSYN) 375 MG tablet Take 1 tablet (375 mg total) by mouth 2 (two) times daily with a meal. Patient not taking: Reported on 01/09/2022 04/16/21   Arthor Captain, PA-C  OLANZapine (ZYPREXA) 10 MG tablet Take 10 mg by mouth at bedtime. Patient not taking: Reported on 01/09/2022    [provider]  OLANZapine (ZYPREXA) 2.5 MG tablet Take 2.5 mg by mouth every morning. Patient not taking: Reported on 01/09/2022    [provider]  PRESCRIPTION MEDICATION Take 1 tablet by mouth daily. For adhd and bipolar ( on 2 different meds) Patient not taking: Reported on 01/09/2022    [provider]    Family History History reviewed. No pertinent family history.  Social History Social History   Tobacco Use   Smoking status: Every Day    Packs/day: 1.00    Years: 10.00    Total pack years:  10.00    Types: Cigarettes   Smokeless tobacco: Never  Vaping Use   Vaping Use: Never used  Substance Use Topics   Alcohol use: Yes    Comment: occ   Drug use: Yes    Types: Marijuana    Comment: 08/08/2014 "smoke marijuana qd"     Allergies   Patient has no known allergies.   Review of Systems Review of Systems Per HPI  Physical Exam Triage Vital Signs ED Triage Vitals  Enc Vitals Group     BP 01/09/22 1030 129/78     Pulse Rate 01/09/22 1030 60     Resp 01/09/22 1030 18     Temp 01/09/22 1030 98 F (36.7 C)     Temp Source 01/09/22 1030 Oral     SpO2 01/09/22 1030 98 %     Weight --      Height --      Head Circumference --      Peak Flow  --      Pain Score 01/09/22 1028 4     Pain Loc --      Pain Edu? --      Excl. in [place]? --    No data found.  Updated Vital Signs BP 129/78 (BP Location: Left Arm)   Pulse 60   Temp 98 F (36.7 C) (Oral)   Resp 18   SpO2 98%   Visual Acuity Right Eye Distance:   Left Eye Distance:   Bilateral Distance:    Right Eye Near:   Left Eye Near:    Bilateral Near:     Physical Exam Constitutional:      General: He is not in acute distress.    Appearance: Normal appearance. He is not toxic-appearing or diaphoretic.  HENT:     Head: Normocephalic and atraumatic.     Mouth/Throat:     Dentition: Abnormal dentition. Dental tenderness and gingival swelling present.     Comments: Broken tooth in the right lower dentition with mild surrounding erythema and swelling.  No obvious purulent drainage noted. Eyes:     Extraocular Movements: Extraocular movements intact.     Conjunctiva/sclera: Conjunctivae normal.  Pulmonary:     Effort: Pulmonary effort is normal.  Neurological:     General: No focal deficit present.     Mental Status: He is alert and oriented to person, place, and time. Mental status is at baseline.  Psychiatric:        Mood and Affect: Mood normal.        Behavior: Behavior normal.        Thought Content: Thought content normal.        Judgment: Judgment normal.      UC Treatments / Results  Labs (all labs ordered are listed, but only abnormal results are displayed) Labs Reviewed - No data to display  EKG   Radiology No results found.  Procedures Procedures (including critical care time)  Medications Ordered in UC Medications - No data to display  Initial Impression / Assessment and Plan / UC Course  I have reviewed the triage vital signs and the nursing notes.  Pertinent labs & imaging results that were available during my care of the patient were reviewed by me and considered in my medical decision making (see chart for details).     Will  treat mild dental infection with Augmentin antibiotic.  Ibuprofen prescribed to take as needed for pain and inflammation.  Patient advised to not take any  additional NSAIDs.  Patient states that he does not take any daily medications so these medications should be safe.  Patient advised to follow-up with dentist for further evaluation and management.  Patient verbalized understanding and was agreeable with plan. Final Clinical Impressions(s) / UC Diagnoses   Final diagnoses:  Pain, dental  Dental infection     Discharge Instructions      It appears that your tooth may be infected.  This is being treated with an antibiotic.  You have also been sent ibuprofen to take as needed for pain.  Please avoid taking any additional ibuprofen, Advil, Aleve.  Follow-up with dentist tomorrow for further evaluation and management.    ED Prescriptions     Medication Sig Dispense Auth. Provider   amoxicillin-clavulanate (AUGMENTIN) 875-125 MG tablet Take 1 tablet by mouth every 12 (twelve) hours. 14 tablet [place], [place] E, [place]   ibuprofen (ADVIL) 600 MG tablet Take 1 tablet (600 mg total) by mouth every 6 (six) hours as needed for mild pain. 30 tablet [place], Michele [place], [place]      PDMP not reviewed this encounter.   Teodora Medici, [place] 01/09/22 1044

## 2022-01-09 NOTE — Discharge Instructions (Signed)
It appears that your tooth may be infected.  This is being treated with an antibiotic.  You have also been sent ibuprofen to take as needed for pain.  Please avoid taking any additional ibuprofen, Advil, Aleve.  Follow-up with dentist tomorrow for further evaluation and management.

## 2022-01-09 NOTE — ED Triage Notes (Signed)
Bottom, right tooth pain.  Onset last Friday of dental pain.  Has not taken any medications

## 2022-04-05 ENCOUNTER — Ambulatory Visit (HOSPITAL_COMMUNITY)
Admission: EM | Admit: 2022-04-05 | Discharge: 2022-04-05 | Disposition: A | Discharge status: Home / Self Care | Payer: BC Managed Care – PPO | Attending: Internal Medicine | Admitting: Internal Medicine

## 2022-04-05 ENCOUNTER — Encounter (HOSPITAL_COMMUNITY): Payer: Self-pay | Admitting: *Deleted

## 2022-04-05 ENCOUNTER — Other Ambulatory Visit: Payer: Self-pay

## 2022-04-05 DIAGNOSIS — L02419 Cutaneous abscess of limb, unspecified: Secondary | ICD-10-CM

## 2022-04-05 DIAGNOSIS — L03116 Cellulitis of left lower limb: Secondary | ICD-10-CM | POA: Diagnosis not present

## 2022-04-05 DIAGNOSIS — L02416 Cutaneous abscess of left lower limb: Secondary | ICD-10-CM | POA: Diagnosis not present

## 2022-04-05 MED ORDER — LIDOCAINE-EPINEPHRINE 1 %-1:100000 IJ SOLN
INTRAMUSCULAR | Status: AC
  Filled 2022-04-05: qty 1

## 2022-04-05 MED ORDER — DOXYCYCLINE HYCLATE 100 MG PO CAPS: 100.0000 mg | ORAL_CAPSULE | Freq: Two times a day (BID) | ORAL | 0 refills | Status: AC

## 2022-04-05 NOTE — Discharge Instructions (Signed)
We drained your cyst today in the clinic and left open to continue to drain.  Continue to use warm compresses to the wound to further encourage drainage from the wound.  You may do this in the shower as well with gentle compresses to the area to allow more infected material to drain.    Take doxycycline antibiotic twice daily for the next 7 days to treat infection to the abscess.  Change your dressings twice daily as the wound heals.  Do not apply any ointments, lotions, or powders to the wound.  You may take Tylenol/ibuprofen as needed for pain once the numbing wears off.  If you notice any worsening signs of infection such as redness, swelling, fever, or worsening drainage, please return to urgent care for reevaluation.

## 2022-04-05 NOTE — ED Triage Notes (Signed)
Pt has an abscess on Lt lower leg. Pt reports he had drainage last night. Pt reports abscess ha been present for 4 days.

## 2022-04-05 NOTE — ED Provider Notes (Addendum)
[facility]    CSN: [id number] Arrival date & time: 04/05/22  1150      History   Chief Complaint Chief Complaint  Patient presents with   Abscess    HPI Connor Weber is a 31 y.o. male.   Patient presents urgent care for evaluation of abscess to the left lower extremity that has been present for the last 4 days.  He states that the abscess is grown in size, pain, and redness in the last 4 days and has begun to drain at home.  Pain to the abscess is a 10 on a scale of 0-10.  He is unsure if he was bit by an insect or exactly how the abscess formed.  He works for [employer] and would like the abscess to be drained so that he can go back to work to perform his job without being in so much pain.  Denies fever/chills, numbness and tingling to the bilateral lower extremities, history of diabetes, and recent falls.  He has not attempted use of any over-the-counter medications prior to arrival urgent care and denies recent antibiotic use.     Past Medical History:  Diagnosis Date   ADHD (attention deficit hyperactivity disorder)    Anxiety    Bipolar 1 disorder (HCC)    Depression    Manic, depressive (HCC)     Patient Active Problem List   Diagnosis Date Noted   Polysubstance abuse (HCC) 06/01/2019   Lactose intolerance 08/09/2014   Colitis 08/08/2014   Abdominal pain 08/08/2014   Bipolar disorder (HCC) 08/08/2014   Tobacco abuse 08/08/2014   Alcohol use 08/08/2014   Marijuana abuse 08/08/2014   Pelvic ascites 08/08/2014   Abdominal pain, acute 03/18/2013   Nausea & vomiting 03/18/2013   Bradycardia 03/18/2013    Past Surgical History:  Procedure Laterality Date   HAND TENDON SURGERY Right ~ 03/2014   WRIST SURGERY         Home Medications    Prior to Admission medications   Medication Sig Start Date End Date Taking? Authorizing Provider  doxycycline (VIBRAMYCIN) 100 MG capsule Take 1 capsule (100 mg total) by mouth 2 (two) times daily for 7 days.  04/05/22 04/12/22 Yes Everado Pillsbury, Donavan Burnet, FNP  carbamazepine (TEGRETOL) 200 MG tablet Take 1 tablet (200 mg total) by mouth 2 (two) times daily. Patient not taking: Reported on 01/09/2022 06/02/19   Lenard Lance, FNP  ibuprofen (ADVIL) 600 MG tablet Take 1 tablet (600 mg total) by mouth every 6 (six) hours as needed for mild pain. 01/09/22   Gustavus Bryant, FNP  lisdexamfetamine (VYVANSE) 30 MG capsule Take 30 mg by mouth daily as needed. When angry to calm down Patient not taking: Reported on 01/09/2022    [provider]  LORazepam (ATIVAN) 0.5 MG tablet Take 0.5 mg by mouth every 8 (eight) hours. Patient not taking: Reported on 01/09/2022    [provider]  methocarbamol (ROBAXIN) 500 MG tablet Take 1 tablet (500 mg total) by mouth 2 (two) times daily. Patient not taking: Reported on 01/09/2022 10/01/15   Fayrene Helper, PA-C  naproxen (NAPROSYN) 375 MG tablet Take 1 tablet (375 mg total) by mouth 2 (two) times daily with a meal. Patient not taking: Reported on 01/09/2022 04/16/21   Arthor Captain, PA-C  OLANZapine (ZYPREXA) 10 MG tablet Take 10 mg by mouth at bedtime. Patient not taking: Reported on 01/09/2022    [provider]  OLANZapine (ZYPREXA) 2.5 MG tablet  Take 2.5 mg by mouth every morning. Patient not taking: Reported on 01/09/2022    [provider]  PRESCRIPTION MEDICATION Take 1 tablet by mouth daily. For adhd and bipolar ( on 2 different meds) Patient not taking: Reported on 01/09/2022    [provider]    Family History History reviewed. No pertinent family history.  Social History Social History   Tobacco Use   Smoking status: Every Day    Packs/day: 1.00    Years: 10.00    Total pack years: 10.00    Types: Cigarettes   Smokeless tobacco: Never  Vaping Use   Vaping Use: Never used  Substance Use Topics   Alcohol use: Yes    Comment: occ   Drug use: Yes    Types: Marijuana    Comment: 08/08/2014 "smoke marijuana qd"      Allergies   Patient has no known allergies.   Review of Systems Review of Systems Per HPI  Physical Exam Triage Vital Signs ED Triage Vitals  Enc Vitals Group     BP 04/05/22 1215 112/77     Pulse Rate 04/05/22 1215 74     Resp 04/05/22 1215 18     Temp 04/05/22 1215 97.9 F (36.6 C)     Temp src --      SpO2 04/05/22 1215 95 %     Weight --      Height --      Head Circumference --      Peak Flow --      Pain Score 04/05/22 1213 10     Pain Loc --      Pain Edu? --      Excl. in [place]? --    No data found.  Updated Vital Signs BP 112/77   Pulse 74   Temp 97.9 F (36.6 C)   Resp 18   SpO2 95%   Visual Acuity Right Eye Distance:   Left Eye Distance:   Bilateral Distance:    Right Eye Near:   Left Eye Near:    Bilateral Near:     Physical Exam Vitals and nursing note reviewed.  Constitutional:      Appearance: He is not ill-appearing or toxic-appearing.  HENT:     Head: Normocephalic and atraumatic.     Right Ear: Hearing and external ear normal.     Left Ear: Hearing and external ear normal.     Nose: Nose normal.     Mouth/Throat:     Lips: Pink.  Eyes:     General: Lids are normal. Vision grossly intact. Gaze aligned appropriately.     Extraocular Movements: Extraocular movements intact.     Conjunctiva/sclera: Conjunctivae normal.  Pulmonary:     Effort: Pulmonary effort is normal.  Musculoskeletal:     Cervical back: Neck supple.  Skin:    General: Skin is warm and dry.     Capillary Refill: Capillary refill takes less than 2 seconds.     Findings: Abscess present. No rash.          Comments: Neurovascularly intact distal to abscess.  +2 anterior tibialis pulse on the left.  Neurological:     General: No focal deficit present.     Mental Status: He is alert and oriented to person, place, and time. Mental status is at baseline.     Cranial Nerves: No dysarthria or facial asymmetry.  Psychiatric:        Mood and Affect: Mood normal.  Speech: Speech normal.        Behavior: Behavior normal.        Thought Content: Thought content normal.        Judgment: Judgment normal.         UC Treatments / Results  Labs (all labs ordered are listed, but only abnormal results are displayed) Labs Reviewed - No data to display  EKG   Radiology No results found.  Procedures Incision and Drainage  Date/Time: 04/05/2022 1:18 PM  Performed by: Carlisle Beers, FNP Authorized by: Carlisle Beers, FNP   Consent:    Consent obtained:  Verbal   Consent given by:  Patient   Risks, benefits, and alternatives were discussed: yes     Risks discussed:  Bleeding, damage to other organs, infection, incomplete drainage and pain   Alternatives discussed:  No treatment and delayed treatment Universal protocol:    Procedure explained and questions answered to patient or proxy's satisfaction: yes     Patient identity confirmed:  Verbally with patient Location:    Type:  Abscess   Location:  Lower extremity   Lower extremity location:  Leg   Leg location:  L lower leg Pre-procedure details:    Skin preparation:  Povidone-iodine Sedation:    Sedation type:  None Anesthesia:    Anesthesia method:  Local infiltration   Local anesthetic:  Lidocaine 1% WITH epi Procedure type:    Complexity:  Simple Procedure details:    Incision types:  Stab incision   Drainage:  Purulent and bloody   Drainage amount:  Moderate   Wound treatment:  Wound left open   Packing materials:  None Post-procedure details:    Procedure completion:  Tolerated well, no immediate complications  (including critical care time)  Medications Ordered in UC Medications - No data to display  Initial Impression / Assessment and Plan / UC Course  I have reviewed the triage vital signs and the nursing notes.  Pertinent labs & imaging results that were available during my care of the patient were reviewed by me and considered in my medical  decision making (see chart for details).   1.  Cellulitis and abscess of left leg Abscess drained in clinic.  See procedure note above for incision and drainage procedure details.  Patient tolerated procedure well and wound left open to continue to drain.  Wound cleansed and dressed in the clinic with nonstick gauze, tape, and Ace wrap bandage.  Advised patient to take ibuprofen and Tylenol every 6 hours as needed at home for any pain he may experience related to the abscess once the numbing wears off.  He is to change the bandage twice daily and continue warm compresses to the area to allow further hardened infected material to drain.  Advised to return to urgent care if he notices any worsening signs of infection.  Patient placed on doxycycline twice daily for the next 7 days to treat infected abscess.  He denies allergies to antibiotics and expresses agreement with this plan.   Discussed physical exam and available lab work findings in clinic with patient.  Counseled patient regarding appropriate use of medications and potential side effects for all medications recommended or prescribed today. Discussed red flag signs and symptoms of worsening condition,when to call the PCP office, return to urgent care, and when to seek higher level of care in the emergency department. Patient verbalizes understanding and agreement with plan. All questions answered. Patient discharged in stable condition.  Final Clinical Impressions(s) / UC Diagnoses   Final diagnoses:  Cellulitis and abscess of leg     Discharge Instructions      We drained your cyst today in the clinic and left open to continue to drain.  Continue to use warm compresses to the wound to further encourage drainage from the wound.  You may do this in the shower as well with gentle compresses to the area to allow more infected material to drain.    Take doxycycline antibiotic twice daily for the next 7 days to treat infection to the  abscess.  Change your dressings twice daily as the wound heals.  Do not apply any ointments, lotions, or powders to the wound.  You may take Tylenol/ibuprofen as needed for pain once the numbing wears off.  If you notice any worsening signs of infection such as redness, swelling, fever, or worsening drainage, please return to urgent care for reevaluation.     ED Prescriptions     Medication Sig Dispense Auth. Provider   doxycycline (VIBRAMYCIN) 100 MG capsule Take 1 capsule (100 mg total) by mouth 2 (two) times daily for 7 days. 14 capsule Carlisle Beers, FNP      PDMP not reviewed this encounter.   Carlisle Beers, FNP 04/05/22 1318    Carlisle Beers, [place] 04/05/22 1323

## 2022-09-04 ENCOUNTER — Other Ambulatory Visit: Payer: Self-pay

## 2022-09-04 ENCOUNTER — Emergency Department (HOSPITAL_BASED_OUTPATIENT_CLINIC_OR_DEPARTMENT_OTHER)
Admission: EM | Admit: 2022-09-04 | Discharge: 2022-09-04 | Disposition: A | Discharge status: Home / Self Care | Payer: BC Managed Care – PPO | Attending: Emergency Medicine | Admitting: Emergency Medicine

## 2022-09-04 ENCOUNTER — Emergency Department (HOSPITAL_BASED_OUTPATIENT_CLINIC_OR_DEPARTMENT_OTHER): Payer: BC Managed Care – PPO

## 2022-09-04 DIAGNOSIS — S0101XA Laceration without foreign body of scalp, initial encounter: Secondary | ICD-10-CM | POA: Insufficient documentation

## 2022-09-04 DIAGNOSIS — Z23 Encounter for immunization: Secondary | ICD-10-CM | POA: Insufficient documentation

## 2022-09-04 DIAGNOSIS — S0990XA Unspecified injury of head, initial encounter: Secondary | ICD-10-CM | POA: Diagnosis present

## 2022-09-04 DIAGNOSIS — F17200 Nicotine dependence, unspecified, uncomplicated: Secondary | ICD-10-CM | POA: Insufficient documentation

## 2022-09-04 MED ORDER — LIDOCAINE-EPINEPHRINE (PF) 2 %-1:200000 IJ SOLN
10.0000 mL | Freq: Once | INTRAMUSCULAR | Status: AC
  Administered 2022-09-04: 10 mL via INTRADERMAL
  Filled 2022-09-04: qty 20

## 2022-09-04 MED ORDER — TETANUS-DIPHTH-ACELL PERTUSSIS 5-2.5-18.5 LF-MCG/0.5 IM SUSY
0.5000 mL | PREFILLED_SYRINGE | Freq: Once | INTRAMUSCULAR | Status: AC
  Administered 2022-09-04: 0.5 mL via INTRAMUSCULAR
  Filled 2022-09-04: qty 0.5

## 2022-09-04 MED ORDER — OXYCODONE-ACETAMINOPHEN 5-325 MG PO TABS
1.0000 | ORAL_TABLET | Freq: Once | ORAL | Status: AC
  Administered 2022-09-04: 1 via ORAL
  Filled 2022-09-04: qty 1

## 2022-09-04 MED ORDER — IBUPROFEN 600 MG PO TABS: 600.0000 mg | ORAL_TABLET | Freq: Four times a day (QID) | ORAL | 0 refills | Status: DC | PRN

## 2022-09-04 NOTE — ED Notes (Signed)
Patient transported to CT 
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
## 2022-09-04 NOTE — Discharge Instructions (Signed)
Please keep head dry for the first 24 hours.  After that you can wash your hair gently with soap and water.  Have your surgical staple removed in 7 days.  Take ibuprofen as needed for pain.  Monitor for any signs of infection.

## 2022-09-23 ENCOUNTER — Ambulatory Visit
Admission: EM | Admit: 2022-09-23 | Discharge: 2022-09-23 | Disposition: A | Discharge status: Home / Self Care | Payer: BC Managed Care – PPO | Attending: Urgent Care | Admitting: Urgent Care

## 2022-09-23 DIAGNOSIS — Z4802 Encounter for removal of sutures: Secondary | ICD-10-CM | POA: Diagnosis not present

## 2022-09-23 NOTE — ED Provider Notes (Signed)
[facility]  Note:  This document was prepared using Systems analyst and may include unintentional dictation errors.  MRN: [id number] DOB: 1990-10-17  Subjective:   Connor Weber is a 32 y.o. male presenting for staple removal.  Patient had 3 placed over the scalp on 09/04/2022.  He removed 1 on his own and is presenting today for removal of the other 2.  No fever, drainage of pus or bleeding.  No current facility-administered medications for this encounter.  Current Outpatient Medications:    carbamazepine (TEGRETOL) 200 MG tablet, Take 1 tablet (200 mg total) by mouth 2 (two) times daily. (Patient not taking: Reported on 01/09/2022), Disp: 60 tablet, Rfl: 0   ibuprofen (ADVIL) 600 MG tablet, Take 1 tablet (600 mg total) by mouth every 6 (six) hours as needed., Disp: 30 tablet, Rfl: 0   lisdexamfetamine (VYVANSE) 30 MG capsule, Take 30 mg by mouth daily as needed. When angry to calm down (Patient not taking: Reported on 01/09/2022), Disp: , Rfl:    LORazepam (ATIVAN) 0.5 MG tablet, Take 0.5 mg by mouth every 8 (eight) hours. (Patient not taking: Reported on 01/09/2022), Disp: , Rfl:    methocarbamol (ROBAXIN) 500 MG tablet, Take 1 tablet (500 mg total) by mouth 2 (two) times daily. (Patient not taking: Reported on 01/09/2022), Disp: 20 tablet, Rfl: 0   naproxen (NAPROSYN) 375 MG tablet, Take 1 tablet (375 mg total) by mouth 2 (two) times daily with a meal. (Patient not taking: Reported on 01/09/2022), Disp: 20 tablet, Rfl: 0   OLANZapine (ZYPREXA) 10 MG tablet, Take 10 mg by mouth at bedtime. (Patient not taking: Reported on 01/09/2022), Disp: , Rfl:    OLANZapine (ZYPREXA) 2.5 MG tablet, Take 2.5 mg by mouth every morning. (Patient not taking: Reported on 01/09/2022), Disp: , Rfl:    PRESCRIPTION MEDICATION, Take 1 tablet by mouth daily. For adhd and bipolar ( on 2 different meds) (Patient not taking: Reported on 01/09/2022), Disp: , Rfl:    No Known  Allergies  Past Medical History:  Diagnosis Date   ADHD (attention deficit hyperactivity disorder)    Anxiety    Bipolar 1 disorder ([place])    Depression    Manic, depressive ([place])      Past Surgical History:  Procedure Laterality Date   HAND TENDON SURGERY Right ~ 03/2014   WRIST SURGERY      No family history on file.  Social History   Tobacco Use   Smoking status: Every Day    Packs/day: 1.00    Years: 10.00    Additional pack years: 0.00    Total pack years: 10.00    Types: Cigarettes   Smokeless tobacco: Never  Vaping Use   Vaping Use: Never used  Substance Use Topics   Alcohol use: Yes    Comment: occ   Drug use: Yes    Types: Marijuana    Comment: 08/08/2014 "smoke marijuana qd"    ROS   Objective:   Vitals: BP 108/73 (BP Location: Right Arm)   Pulse 90   Temp 98.3 F (36.8 C) (Oral)   Resp 16   SpO2 95%   Physical Exam Constitutional:      General: He is not in acute distress.    Appearance: Normal appearance. He is well-developed and normal weight. He is not ill-appearing, toxic-appearing or diaphoretic.  HENT:     Head: Normocephalic and atraumatic.      Right Ear: External ear normal.  Left Ear: External ear normal.     Nose: Nose normal.     Mouth/Throat:     Pharynx: Oropharynx is clear.  Eyes:     General: No scleral icterus.       Right eye: No discharge.        Left eye: No discharge.     Extraocular Movements: Extraocular movements intact.  Cardiovascular:     Rate and Rhythm: Normal rate.  Pulmonary:     Effort: Pulmonary effort is normal.  Musculoskeletal:     Cervical back: Normal range of motion.  Neurological:     Mental Status: He is alert and oriented to person, place, and time.  Psychiatric:        Mood and Affect: Mood normal.        Behavior: Behavior normal.        Thought Content: Thought content normal.        Judgment: Judgment normal.     2 staples removed without incident.  Assessment and Plan :    PDMP not reviewed this encounter.  1. Encounter for staple removal     Anticipatory guidance provided.   Jaynee Eagles, [place] 09/23/22 1737

## 2022-09-23 NOTE — ED Triage Notes (Signed)
Pt for staple removal to left parietal-states he had 3-"I pulled out one"-2 remaining-NAD-steady gait

## 2022-12-28 ENCOUNTER — Ambulatory Visit
Admission: EM | Admit: 2022-12-28 | Discharge: 2022-12-28 | Disposition: A | Discharge status: Home / Self Care | Payer: MEDICAID | Attending: Physician Assistant | Admitting: Physician Assistant

## 2022-12-28 DIAGNOSIS — H6121 Impacted cerumen, right ear: Secondary | ICD-10-CM

## 2022-12-28 NOTE — Discharge Instructions (Signed)
We were able to remove wax from your ear so glad you are feeling better.  If you develop any other symptoms including pain, drainage, fever you should be seen immediately.

## 2022-12-28 NOTE — ED Triage Notes (Signed)
Pt presents to UC w/ c/o right ear fullness, decreased hearing x3-4 weeks. Tried ear drops x1 week. No drainage, no pain.

## 2022-12-28 NOTE — ED Provider Notes (Signed)
[facility]    CSN: [id number] Arrival date & time: 12/28/22  1605      History   Chief Complaint No chief complaint on file.   HPI Connor Weber is a 32 y.o. male.   Patient presents today with a several week history of right ear fullness and decreased hearing.  Reports he has been using over-the-counter eardrops to see if cerumen impaction was contributing to his symptoms of this has been ineffective in managing his symptoms.  He denies any associated pain.  Denies any recent illness including cough, congestion, fever, nausea, vomiting.  He does not use Q-tips.  Does occasionally use earbuds but does not use these regularly.  He denies any recent swimming or airplane travel.  Denies episodes of similar symptoms in the past.    Past Medical History:  Diagnosis Date   ADHD (attention deficit hyperactivity disorder)    Anxiety    Bipolar 1 disorder ([facility])    Depression    Manic, depressive ([facility])     Patient Active Problem List   Diagnosis Date Noted   Polysubstance abuse ([facility]) 06/01/2019   Lactose intolerance 08/09/2014   Colitis 08/08/2014   Abdominal pain 08/08/2014   Bipolar disorder ([facility]) 08/08/2014   Tobacco abuse 08/08/2014   Alcohol use 08/08/2014   Marijuana abuse 08/08/2014   Pelvic ascites 08/08/2014   Abdominal pain, acute 03/18/2013   Nausea & vomiting 03/18/2013   Bradycardia 03/18/2013    Past Surgical History:  Procedure Laterality Date   HAND TENDON SURGERY Right ~ 03/2014   WRIST SURGERY         Home Medications    Prior to Admission medications   Medication Sig Start Date End Date Taking? Authorizing Provider  carbamazepine (TEGRETOL) 200 MG tablet Take 1 tablet (200 mg total) by mouth 2 (two) times daily. Patient not taking: Reported on 01/09/2022 06/02/19   Lenard Lance, FNP  ibuprofen (ADVIL) 600 MG tablet Take 1 tablet (600 mg total) by mouth every 6 (six) hours as needed. 09/04/22   Fayrene Helper, PA-C  lisdexamfetamine (VYVANSE)  30 MG capsule Take 30 mg by mouth daily as needed. When angry to calm down Patient not taking: Reported on 01/09/2022    [provider]  LORazepam (ATIVAN) 0.5 MG tablet Take 0.5 mg by mouth every 8 (eight) hours. Patient not taking: Reported on 01/09/2022    [provider]  methocarbamol (ROBAXIN) 500 MG tablet Take 1 tablet (500 mg total) by mouth 2 (two) times daily. Patient not taking: Reported on 01/09/2022 10/01/15   Fayrene Helper, PA-C  naproxen (NAPROSYN) 375 MG tablet Take 1 tablet (375 mg total) by mouth 2 (two) times daily with a meal. Patient not taking: Reported on 01/09/2022 04/16/21   Arthor Captain, PA-C  OLANZapine (ZYPREXA) 10 MG tablet Take 10 mg by mouth at bedtime. Patient not taking: Reported on 01/09/2022    [provider]  OLANZapine (ZYPREXA) 2.5 MG tablet Take 2.5 mg by mouth every morning. Patient not taking: Reported on 01/09/2022    [provider]  PRESCRIPTION MEDICATION Take 1 tablet by mouth daily. For adhd and bipolar ( on 2 different meds) Patient not taking: Reported on 01/09/2022    [provider]    Family History History reviewed. No pertinent family history.  Social History Social History   Tobacco Use   Smoking status: Every Day    Packs/day: 1.00    Years: 10.00    Additional pack years:  0.00    Total pack years: 10.00    Types: Cigarettes   Smokeless tobacco: Never  Vaping Use   Vaping Use: Never used  Substance Use Topics   Alcohol use: Yes    Comment: occ   Drug use: Yes    Types: Marijuana    Comment: 08/08/2014 "smoke marijuana qd"     Allergies   Patient has no known allergies.   Review of Systems Review of Systems  Constitutional:  Positive for activity change. Negative for appetite change, fatigue and fever.  HENT:  Positive for hearing loss. Negative for congestion, ear discharge, ear pain and sore throat.   Respiratory:  Negative for cough.   Gastrointestinal:  Negative for  abdominal pain, diarrhea, nausea and vomiting.  Neurological:  Negative for headaches.     Physical Exam Triage Vital Signs ED Triage Vitals  Enc Vitals Group     BP 12/28/22 1629 121/71     Pulse Rate 12/28/22 1629 83     Resp 12/28/22 1629 16     Temp 12/28/22 1629 98.1 F (36.7 C)     Temp Source 12/28/22 1629 Oral     SpO2 12/28/22 1629 95 %     Weight --      Height --      Head Circumference --      Peak Flow --      Pain Score 12/28/22 1632 0     Pain Loc --      Pain Edu? --      Excl. in GC? --    No data found.  Updated Vital Signs BP 121/71 (BP Location: Right Arm)   Pulse 83   Temp 98.1 F (36.7 C) (Oral)   Resp 16   SpO2 95%   Visual Acuity Right Eye Distance:   Left Eye Distance:   Bilateral Distance:    Right Eye Near:   Left Eye Near:    Bilateral Near:     Physical Exam Vitals reviewed.  Constitutional:      General: He is awake.     Appearance: Normal appearance. He is well-developed. He is not ill-appearing.     Comments: Very pleasant male appears stated age in no acute distress sitting comfortably in exam room  HENT:     Head: Normocephalic and atraumatic.     Right Ear: Tympanic membrane, ear canal and external ear normal. There is impacted cerumen (Resolved). Tympanic membrane is not erythematous or bulging.     Left Ear: Tympanic membrane, ear canal and external ear normal. Tympanic membrane is not erythematous or bulging.     Ears:     Comments: Right ear: Cerumen impaction noted.  Unable to visualize TM.  This resolved with irrigation in clinic revealing normal TM.    Nose: Nose normal.     Mouth/Throat:     Pharynx: Uvula midline. No oropharyngeal exudate or posterior oropharyngeal erythema.  Cardiovascular:     Rate and Rhythm: Normal rate and regular rhythm.     Heart sounds: Normal heart sounds, S1 normal and S2 normal. No murmur heard. Pulmonary:     Effort: Pulmonary effort is normal. No accessory muscle usage or  respiratory distress.     Breath sounds: Normal breath sounds. No stridor. No wheezing, rhonchi or rales.     Comments: Clear to auscultation bilaterally Neurological:     Mental Status: He is alert.  Psychiatric:        Behavior: Behavior is cooperative.  UC Treatments / Results  Labs (all labs ordered are listed, but only abnormal results are displayed) Labs Reviewed - No data to display  EKG   Radiology No results found.  Procedures Procedures (including critical care time)  Medications Ordered in UC Medications - No data to display  Initial Impression / Assessment and Plan / UC Course  I have reviewed the triage vital signs and the nursing notes.  Pertinent labs & imaging results that were available during my care of the patient were reviewed by me and considered in my medical decision making (see chart for details).     Patient is well-appearing, afebrile, nontoxic, nontachycardic.  Cerumen impaction was noted on exam and resolved with in office irrigation revealing normal TM.  He was encouraged to use over-the-counter ceruminolytic medications as needed for additional symptom relief.  Discussed that if he has any worsening or changing symptoms including otorrhea, pain, fever, nausea, vomiting he needs to be seen immediately.  All questions answered to patient satisfaction.  Final Clinical Impressions(s) / UC Diagnoses   Final diagnoses:  Impacted cerumen of right ear  Hearing loss of right ear due to cerumen impaction     Discharge Instructions      We were able to remove wax from your ear so glad you are feeling better.  If you develop any other symptoms including pain, drainage, fever you should be seen immediately.     ED Prescriptions   None    PDMP not reviewed this encounter.   Jeani Hawking, PA-C 12/28/22 1743
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
## 2023-05-22 ENCOUNTER — Ambulatory Visit (HOSPITAL_COMMUNITY)
Admission: EM | Admit: 2023-05-22 | Discharge: 2023-05-22 | Disposition: A | Discharge status: Home / Self Care | Payer: MEDICAID | Attending: Family Medicine | Admitting: Family Medicine

## 2023-05-22 ENCOUNTER — Encounter (HOSPITAL_COMMUNITY): Payer: Self-pay

## 2023-05-22 DIAGNOSIS — M79641 Pain in right hand: Secondary | ICD-10-CM

## 2023-05-22 DIAGNOSIS — L03012 Cellulitis of left finger: Secondary | ICD-10-CM | POA: Diagnosis not present

## 2023-05-22 DIAGNOSIS — M79642 Pain in left hand: Secondary | ICD-10-CM

## 2023-05-22 MED ORDER — MUPIROCIN 2 % EX OINT: 1.0000 | TOPICAL_OINTMENT | Freq: Two times a day (BID) | CUTANEOUS | 0 refills | Status: DC

## 2023-05-22 MED ORDER — IBUPROFEN 800 MG PO TABS: 800.0000 mg | ORAL_TABLET | Freq: Three times a day (TID) | ORAL | 0 refills | Status: DC | PRN

## 2023-05-22 MED ORDER — CEPHALEXIN 250 MG PO CAPS: 250.0000 mg | ORAL_CAPSULE | Freq: Three times a day (TID) | ORAL | 0 refills | Status: AC

## 2023-05-22 NOTE — Discharge Instructions (Signed)
Take cephalexin 250 mg--1 capsule 3 times daily for 7 days  Take ibuprofen 800 mg--1 tab every 8 hours as needed for pain.  Put mupirocin ointment on the sore areas/callus on your red finger twice daily until improved  Also it would be good to put petroleum jelly/Vaseline on your hands since they are so dry.

## 2023-05-22 NOTE — ED Triage Notes (Signed)
Pt reports a history of bilateral carpel surgery years ago. Pt states his finger have callus on them and they feel swollen. 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

## 2023-05-22 NOTE — ED Provider Notes (Signed)
[facility]    CSN: [id number] Arrival date & time: 05/22/23  1155      History   Chief Complaint No chief complaint on file.   HPI Connor Weber is a 32 y.o. male.   HPI Here for pain in both hands and now some redness and swelling on his left middle finger.  In August and October he had carpal tunnel surgeries.  He states his hands did improve at that time but then in the last week or so his hands up started hurting him worse.  He does have calluses that have been chronically on the palmar surfaces of his fingers.  Now the left middle finger has some erythema and swelling and is hurting more.  No fever or chills Past Medical History:  Diagnosis Date   ADHD (attention deficit hyperactivity disorder)    Anxiety    Bipolar 1 disorder ([facility])    Depression    Manic, depressive ([facility])     Patient Active Problem List   Diagnosis Date Noted   Polysubstance abuse ([facility]) 06/01/2019   Lactose intolerance 08/09/2014   Colitis 08/08/2014   Abdominal pain 08/08/2014   Bipolar disorder ([facility]) 08/08/2014   Tobacco abuse 08/08/2014   Alcohol use 08/08/2014   Marijuana abuse 08/08/2014   Pelvic ascites 08/08/2014   Abdominal pain, acute 03/18/2013   Nausea & vomiting 03/18/2013   Bradycardia 03/18/2013    Past Surgical History:  Procedure Laterality Date   HAND TENDON SURGERY Right ~ 03/2014   WRIST SURGERY         Home Medications    Prior to Admission medications   Medication Sig Start Date End Date Taking? Authorizing Provider  cephALEXin (KEFLEX) 250 MG capsule Take 1 capsule (250 mg total) by mouth 3 (three) times daily for 7 days. 05/22/23 05/29/23 Yes Zenia Resides, MD  ibuprofen (ADVIL) 800 MG tablet Take 1 tablet (800 mg total) by mouth every 8 (eight) hours as needed (pain). 05/22/23  Yes Zenia Resides, MD  mupirocin ointment (BACTROBAN) 2 % Apply 1 Application topically 2 (two) times daily. To affected area till better 05/22/23  Yes Earsie Humm,  Janace Aris, MD  carbamazepine (TEGRETOL) 200 MG tablet Take 1 tablet (200 mg total) by mouth 2 (two) times daily. Patient not taking: Reported on 01/09/2022 06/02/19   Lenard Lance, FNP  lisdexamfetamine (VYVANSE) 30 MG capsule Take 30 mg by mouth daily as needed. When angry to calm down Patient not taking: Reported on 01/09/2022    [provider]  LORazepam (ATIVAN) 0.5 MG tablet Take 0.5 mg by mouth every 8 (eight) hours. Patient not taking: Reported on 01/09/2022    [provider]  OLANZapine (ZYPREXA) 10 MG tablet Take 10 mg by mouth at bedtime. Patient not taking: Reported on 01/09/2022    [provider]  OLANZapine (ZYPREXA) 2.5 MG tablet Take 2.5 mg by mouth every morning. Patient not taking: Reported on 01/09/2022    [provider]  PRESCRIPTION MEDICATION Take 1 tablet by mouth daily. For adhd and bipolar ( on 2 different meds) Patient not taking: Reported on 01/09/2022    [provider]    Family History History reviewed. No pertinent family history.  Social History Social History   Tobacco Use   Smoking status: Every Day    Current packs/day: 1.00    Average packs/day: 1 pack/day for 10.0 years (10.0 ttl pk-yrs)    Types: Cigarettes   Smokeless tobacco: Never  Vaping  Use   Vaping status: Never Used  Substance Use Topics   Alcohol use: Yes    Comment: occ   Drug use: Yes    Types: Marijuana    Comment: 08/08/2014 "smoke marijuana qd"     Allergies   Patient has no known allergies.   Review of Systems Review of Systems   Physical Exam Triage Vital Signs ED Triage Vitals  Encounter Vitals Group     BP 05/22/23 1335 (!) 146/97     Systolic BP Percentile --      Diastolic BP Percentile --      Pulse Rate 05/22/23 1335 97     Resp 05/22/23 1335 16     Temp 05/22/23 1335 98.2 F (36.8 C)     Temp Source 05/22/23 1335 Oral     SpO2 05/22/23 1335 98 %     Weight --      Height --      Head Circumference --       Peak Flow --      Pain Score 05/22/23 1336 6     Pain Loc --      Pain Education --      Exclude from Growth Chart --    No data found.  Updated Vital Signs BP (!) 146/97 (BP Location: Left Arm)   Pulse 97   Temp 98.2 F (36.8 C) (Oral)   Resp 16   SpO2 98%   Visual Acuity Right Eye Distance:   Left Eye Distance:   Bilateral Distance:    Right Eye Near:   Left Eye Near:    Bilateral Near:     Physical Exam Vitals reviewed.  Constitutional:      General: He is not in acute distress.    Appearance: He is not ill-appearing, toxic-appearing or diaphoretic.  Skin:    Coloration: Skin is not jaundiced or pale.     Comments: There are some calluses on the palmar surfaces of all his fingers, about 0.5 cm in diameter.  No drainage from those.  On the radial surface of the left middle finger there is some erythema and tenderness and swelling.  No fluctuance.    Neurological:     Mental Status: He is alert.      UC Treatments / Results  Labs (all labs ordered are listed, but only abnormal results are displayed) Labs Reviewed - No data to display  EKG   Radiology No results found.  Procedures Procedures (including critical care time)  Medications Ordered in UC Medications - No data to display  Initial Impression / Assessment and Plan / UC Course  I have reviewed the triage vital signs and the nursing notes.  Pertinent labs & imaging results that were available during my care of the patient were reviewed by me and considered in my medical decision making (see chart for details).     Keflex is sent in for possible cellulitis of that finger with some Bactroban to place on that callus.  Also ibuprofen is sent in for pain  I have asked him to get back in with his hand specialist about the hand pain. Final Clinical Impressions(s) / UC Diagnoses   Final diagnoses:  Bilateral hand pain  Cellulitis of finger of left hand     Discharge Instructions      Take  cephalexin 250 mg--1 capsule 3 times daily for 7 days  Take ibuprofen 800 mg--1 tab every 8 hours as needed for pain.  Put mupirocin  ointment on the sore areas/callus on your red finger twice daily until improved  Also it would be good to put petroleum jelly/Vaseline on your hands since they are so dry.      ED Prescriptions     Medication Sig Dispense Auth. Provider   ibuprofen (ADVIL) 800 MG tablet Take 1 tablet (800 mg total) by mouth every 8 (eight) hours as needed (pain). 21 tablet Yakir Wenke, Janace Aris, MD   cephALEXin (KEFLEX) 250 MG capsule Take 1 capsule (250 mg total) by mouth 3 (three) times daily for 7 days. 21 capsule Zenia Resides, MD   mupirocin ointment (BACTROBAN) 2 % Apply 1 Application topically 2 (two) times daily. To affected area till better 22 g Marlinda Mike Janace Aris, MD      PDMP not reviewed this encounter.   Zenia Resides, MD 05/22/23 [phone]

## 2023-08-01 IMAGING — DX DG FOOT COMPLETE 3+V*R*
3 series · 3 of 3 positions shown · non-contrast
Comparison: None.

CLINICAL DATA: Right foot injury

EXAM:
RIGHT FOOT COMPLETE - 3+ VIEW

[x foot ap right]
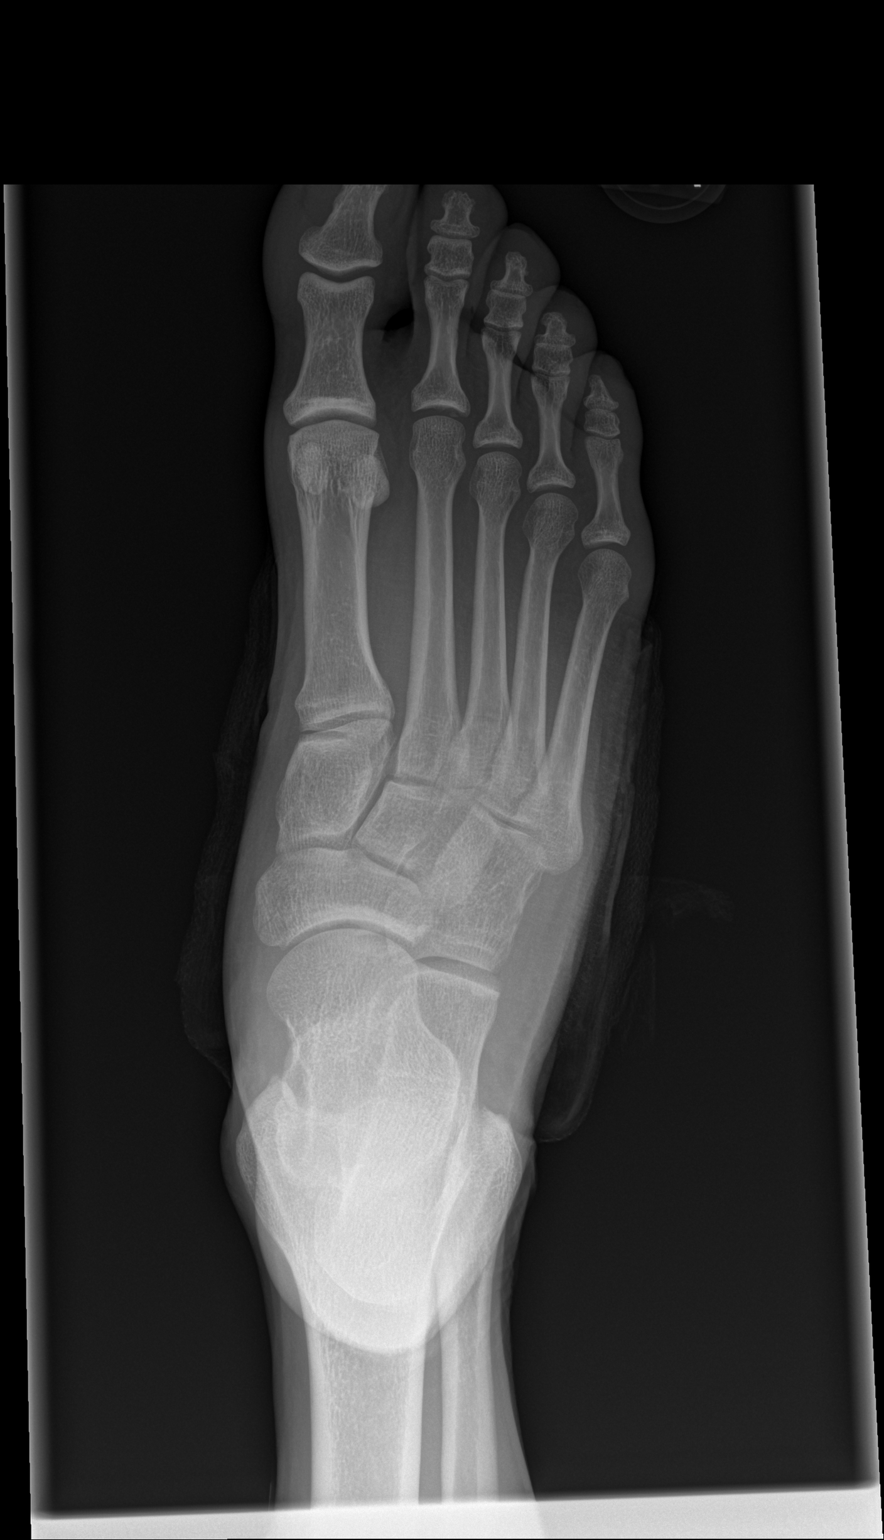

[x foot obl right]
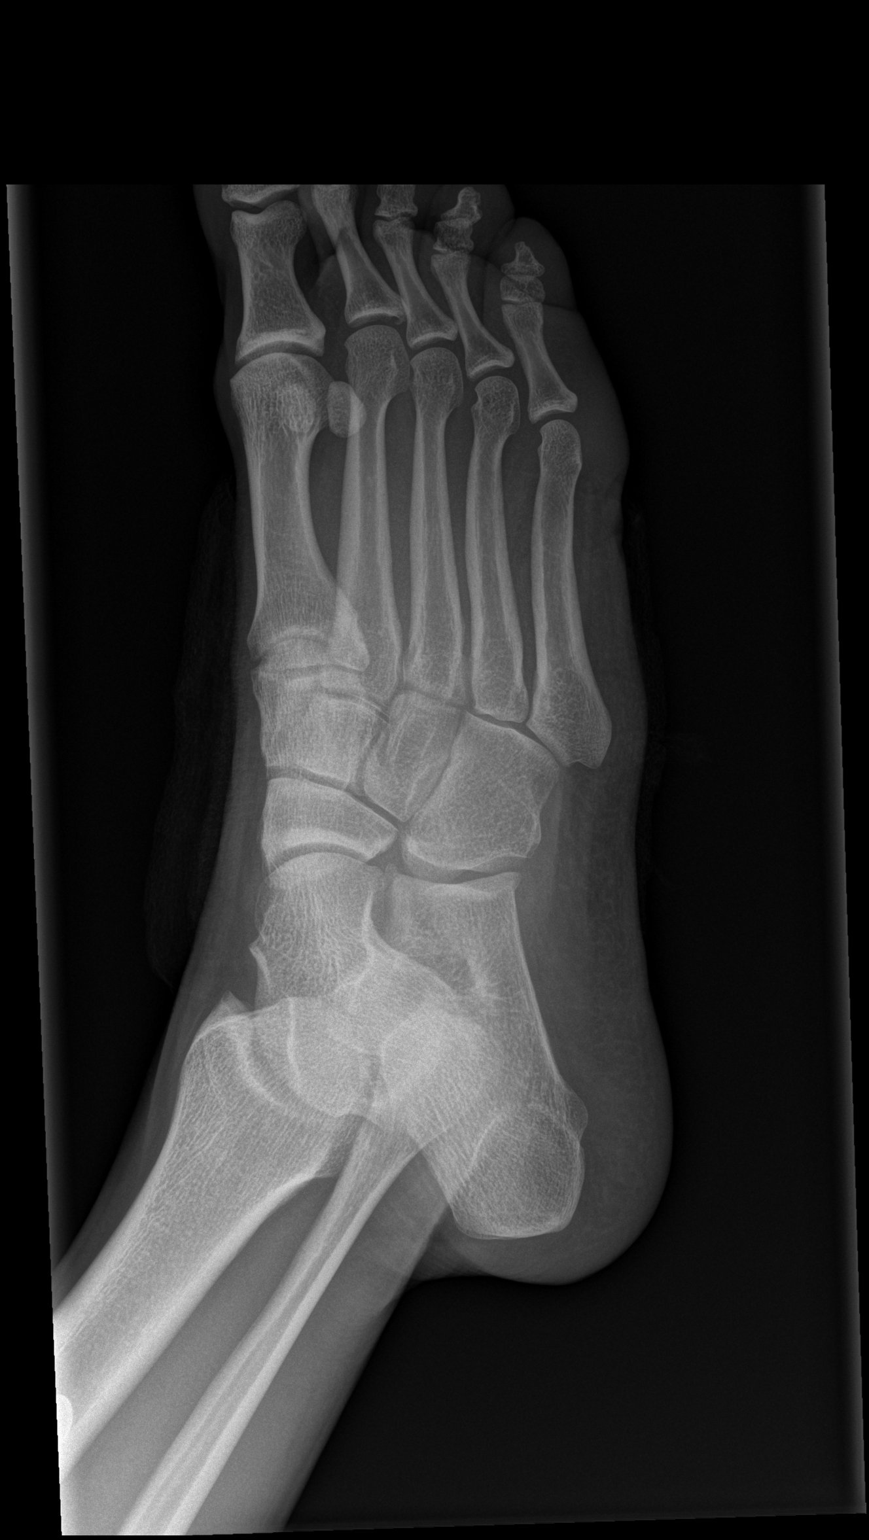

[x foot lat right]
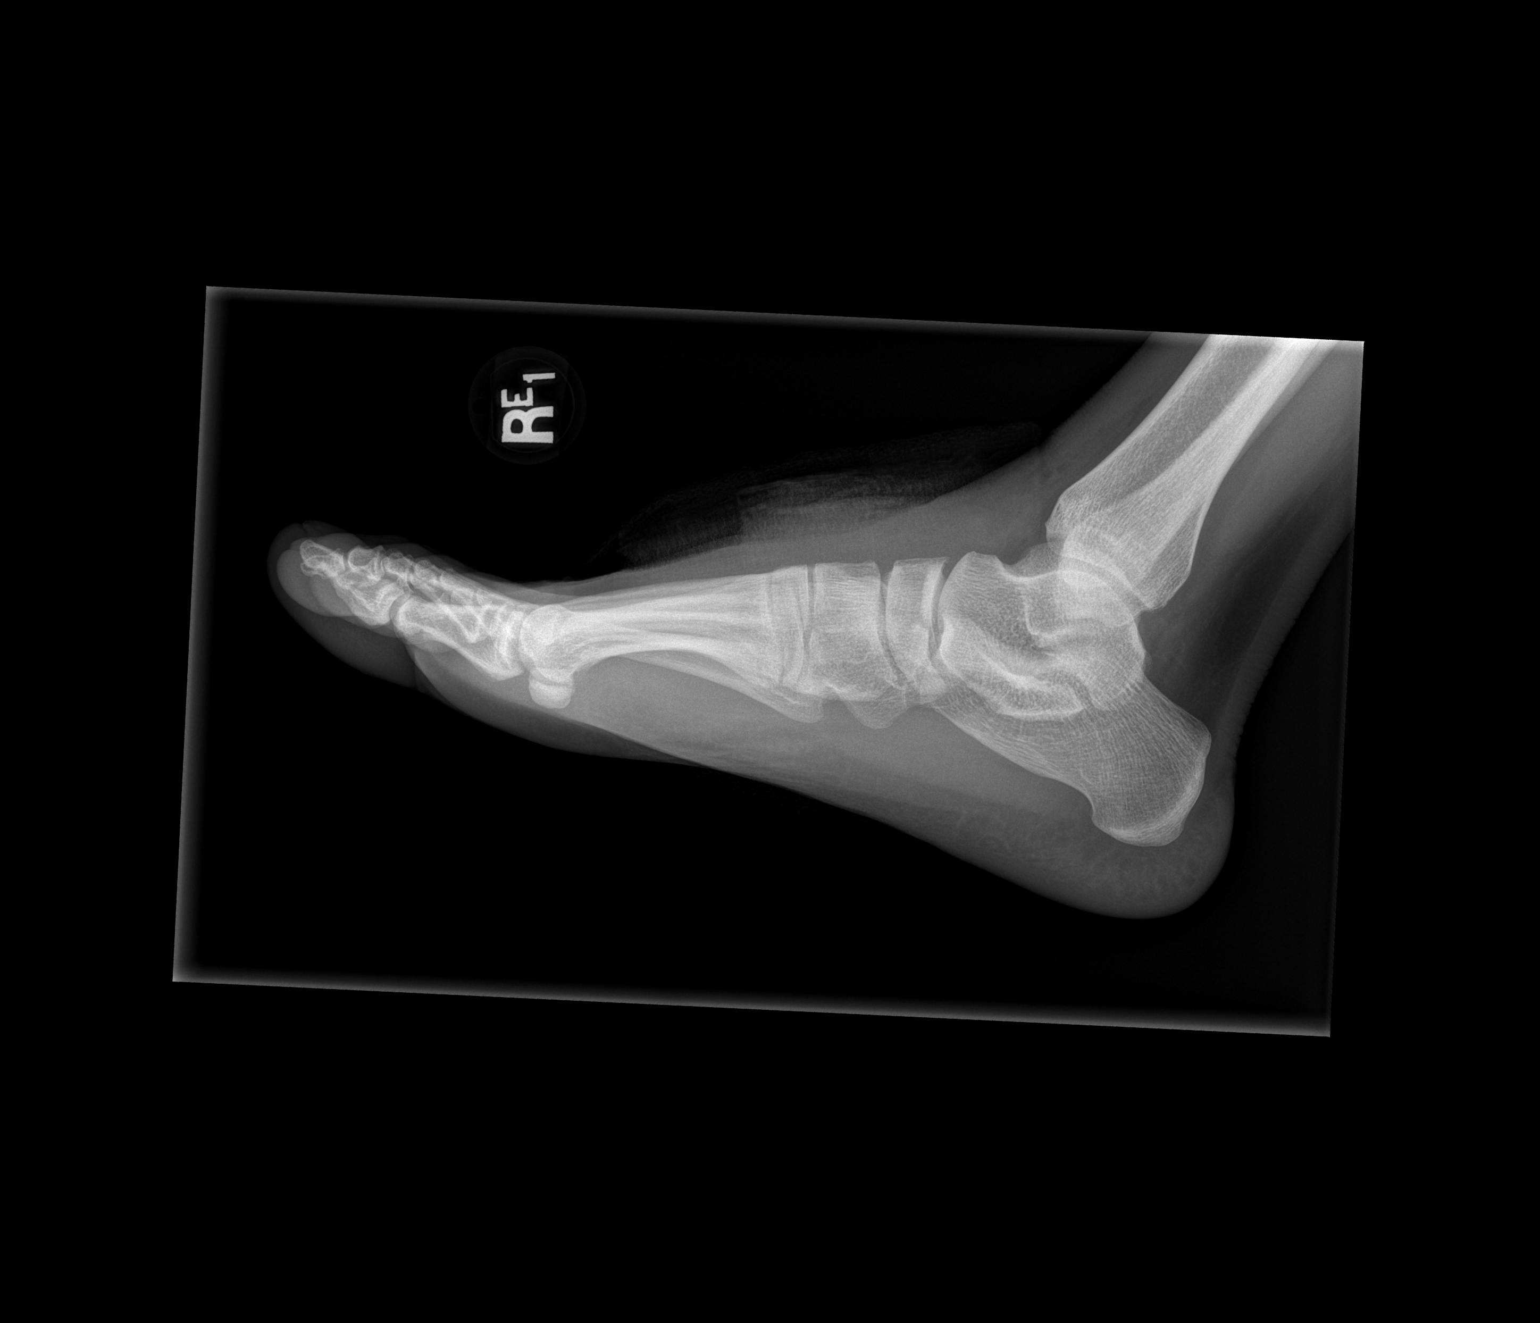

[3 of 3 positions shown; findings below may reference images not displayed]

FINDINGS: Negative for fracture. Normal alignment. No arthropathy. Soft tissue
swelling and laceration anteriorly in the midfoot.
IMPRESSION: Soft tissue injury anterior mid foot.  No fracture identified.

## 2023-11-04 ENCOUNTER — Ambulatory Visit
Admission: EM | Admit: 2023-11-04 | Discharge: 2023-11-04 | Disposition: A | Discharge status: Home / Self Care | Payer: MEDICAID | Attending: Family Medicine | Admitting: Family Medicine

## 2023-11-04 DIAGNOSIS — H6002 Abscess of left external ear: Secondary | ICD-10-CM

## 2023-11-04 DIAGNOSIS — L720 Epidermal cyst: Secondary | ICD-10-CM | POA: Diagnosis not present

## 2023-11-04 MED ORDER — DOXYCYCLINE HYCLATE 100 MG PO CAPS: 100.0000 mg | ORAL_CAPSULE | Freq: Two times a day (BID) | ORAL | 0 refills | Status: DC

## 2023-11-04 MED ORDER — IBUPROFEN 600 MG PO TABS: 600.0000 mg | ORAL_TABLET | Freq: Four times a day (QID) | ORAL | 0 refills | Status: AC | PRN

## 2023-11-04 NOTE — ED Provider Notes (Signed)
 [facility]  Note:  This document was prepared using Conservation officer, historic buildings and may include unintentional dictation errors.  MRN: [id number] DOB: 09/26/1990  Subjective:   Connor Weber is a 33 y.o. male presenting for 2 primary concerns.  Reports 3-day history of significant left ear pain.  No fever, tinnitus, sinus symptoms.  No ear swelling, ear trauma.  No dizziness.  No rashes. Reports 2-year history of a mass over the left side of his cheek.  No tenderness, drainage of pus or bleeding.  Reports that it has gotten really firm after he tried to lance it using a needle.  No current facility-administered medications for this encounter.  Current Outpatient Medications:    carbamazepine  (TEGRETOL ) 200 MG tablet, Take 1 tablet (200 mg total) by mouth 2 (two) times daily. (Patient not taking: Reported on 01/09/2022), Disp: 60 tablet, Rfl: 0   ibuprofen  (ADVIL ) 800 MG tablet, Take 1 tablet (800 mg total) by mouth every 8 (eight) hours as needed (pain)., Disp: 21 tablet, Rfl: 0   lisdexamfetamine (VYVANSE) 30 MG capsule, Take 30 mg by mouth daily as needed. When angry to calm down (Patient not taking: Reported on 01/09/2022), Disp: , Rfl:    LORazepam  (ATIVAN ) 0.5 MG tablet, Take 0.5 mg by mouth every 8 (eight) hours. (Patient not taking: Reported on 01/09/2022), Disp: , Rfl:    mupirocin  ointment (BACTROBAN ) 2 %, Apply 1 Application topically 2 (two) times daily. To affected area till better, Disp: 22 g, Rfl: 0   OLANZapine  (ZYPREXA ) 10 MG tablet, Take 10 mg by mouth at bedtime. (Patient not taking: Reported on 01/09/2022), Disp: , Rfl:    OLANZapine  (ZYPREXA ) 2.5 MG tablet, Take 2.5 mg by mouth every morning. (Patient not taking: Reported on 01/09/2022), Disp: , Rfl:    PRESCRIPTION MEDICATION, Take 1 tablet by mouth daily. For adhd and bipolar ( on 2 different meds) (Patient not taking: Reported on 01/09/2022), Disp: , Rfl:    No Known Allergies  Past Medical  History:  Diagnosis Date   ADHD (attention deficit hyperactivity disorder)    Anxiety    Bipolar 1 disorder (HCC)    Depression    Manic, depressive (HCC)      Past Surgical History:  Procedure Laterality Date   HAND TENDON SURGERY Right ~ 03/2014   WRIST SURGERY      History reviewed. No pertinent family history.  Social History   Tobacco Use   Smoking status: Every Day    Current packs/day: 1.00    Average packs/day: 1 pack/day for 10.0 years (10.0 ttl pk-yrs)    Types: Cigarettes   Smokeless tobacco: Never  Vaping Use   Vaping status: Never Used  Substance Use Topics   Alcohol use: Yes    Comment: occ   Drug use: Yes    Types: Marijuana    Comment: 08/08/2014 "smoke marijuana qd"    ROS   Objective:   Vitals: BP (!) 148/97 (BP Location: Left Arm)   Pulse 61   Temp 98.4 F (36.9 C) (Oral)   Resp 16   SpO2 97%   Physical Exam Constitutional:      General: He is not in acute distress.    Appearance: Normal appearance. He is well-developed and normal weight. He is not ill-appearing, toxic-appearing or diaphoretic.  HENT:     Head: Normocephalic and atraumatic.      Right Ear: External ear normal.     Left Ear: External ear normal.  Ears:      Nose: Nose normal.     Mouth/Throat:     Pharynx: Oropharynx is clear.  Eyes:     General: No scleral icterus.       Right eye: No discharge.        Left eye: No discharge.     Extraocular Movements: Extraocular movements intact.  Cardiovascular:     Rate and Rhythm: Normal rate.  Pulmonary:     Effort: Pulmonary effort is normal.  Musculoskeletal:     Cervical back: Normal range of motion.  Neurological:     Mental Status: He is alert and oriented to person, place, and time.  Psychiatric:        Mood and Affect: Mood normal.        Behavior: Behavior normal.        Thought Content: Thought content normal.        Judgment: Judgment normal.     Assessment and Plan :   PDMP not reviewed this  encounter.  1. Abscess of left ear canal   2. Epidermoid cyst of face    Patient is to start doxycycline  to address abscess of the left ear canal.  Use warm compresses.  Ibuprofen  for pain relief.  Referral placed to dermatology for consultation and consideration for removal of a suspected epidermoid skin cyst.  Counseled patient on potential for adverse effects with medications prescribed/recommended today, ER and return-to-clinic precautions discussed, patient verbalized understanding.    Adolph Hoop, [place] 11/04/23 1232

## 2023-11-04 NOTE — ED Triage Notes (Signed)
Left ear pain x 3 days.   

## 2023-11-04 NOTE — Discharge Instructions (Signed)
 Start doxycycline  for the left ear abscess. Use warm compresses to drain pus. Follow up with the dermatologist for a consult regarding removal of your skin cyst.

## 2024-04-05 ENCOUNTER — Ambulatory Visit
Admission: EM | Admit: 2024-04-05 | Discharge: 2024-04-05 | Disposition: A | Discharge status: Home / Self Care | Payer: MEDICAID | Attending: Physician Assistant | Admitting: Physician Assistant

## 2024-04-05 DIAGNOSIS — H6121 Impacted cerumen, right ear: Secondary | ICD-10-CM | POA: Diagnosis not present

## 2024-04-05 DIAGNOSIS — H6001 Abscess of right external ear: Secondary | ICD-10-CM | POA: Diagnosis not present

## 2024-04-05 MED ORDER — DOXYCYCLINE HYCLATE 100 MG PO CAPS: 100.0000 mg | ORAL_CAPSULE | Freq: Two times a day (BID) | ORAL | 0 refills | Status: AC

## 2024-04-05 NOTE — ED Provider Notes (Signed)
 [facility]    CSN: [id number] Arrival date & time: 04/05/24  1335      History   Chief Complaint Chief Complaint  Patient presents with   Otalgia    HPI Connor Weber is a 33 y.o. male.   Patient presents today with a 2-day history of swelling and pain of his right earlobe.  He has also developed a painful knot in his neck inferior to the ear.  He denies any recent trauma or falls.  He denies any piercings in this ear.  He has had similar episodes in the past that required aspiration and antibiotics.  He denies any recent antibiotics in the past 90 days or history of recurrent skin infections/MRSA.  He has been taking ibuprofen  without improvement of symptoms.  Denies any fever, nausea, vomiting.  He denies any recent airplane travel or swimming.  He does not use earbuds or earplugs on a regular basis.  Denies any recent illness or additional symptoms including cough, congestion, fever, nausea, vomiting.  He does report decreased hearing in his right ear.  He has never seen an ENT.    Past Medical History:  Diagnosis Date   ADHD (attention deficit hyperactivity disorder)    Anxiety    Bipolar 1 disorder (HCC)    Depression    Manic, depressive (HCC)     Patient Active Problem List   Diagnosis Date Noted   Polysubstance abuse (HCC) 06/01/2019   Lactose intolerance 08/09/2014   Colitis 08/08/2014   Abdominal pain 08/08/2014   Bipolar disorder (HCC) 08/08/2014   Tobacco abuse 08/08/2014   Alcohol use 08/08/2014   Marijuana abuse 08/08/2014   Pelvic ascites 08/08/2014   Abdominal pain, acute 03/18/2013   Nausea & vomiting 03/18/2013   Bradycardia 03/18/2013    Past Surgical History:  Procedure Laterality Date   HAND TENDON SURGERY Right ~ 03/2014   WRIST SURGERY         Home Medications    Prior to Admission medications   Medication Sig Start Date End Date Taking? Authorizing Provider  doxycycline  (VIBRAMYCIN ) 100 MG capsule Take 1 capsule (100 mg  total) by mouth 2 (two) times daily. 04/05/24  Yes Kalyssa Anker K, PA-C  ibuprofen  (ADVIL ) 600 MG tablet Take 1 tablet (600 mg total) by mouth every 6 (six) hours as needed. 11/04/23   Christopher Savannah, PA-C    Family History History reviewed. No pertinent family history.  Social History Social History   Tobacco Use   Smoking status: Every Day    Current packs/day: 1.00    Average packs/day: 1 pack/day for 10.0 years (10.0 ttl pk-yrs)    Types: Cigarettes   Smokeless tobacco: Never  Vaping Use   Vaping status: Never Used  Substance Use Topics   Alcohol use: Yes    Comment: occ   Drug use: Yes    Frequency: 2.0 times per week    Types: Marijuana     Allergies   Patient has no known allergies.   Review of Systems Review of Systems  Constitutional:  Positive for activity change. Negative for appetite change, fatigue and fever.  HENT:  Positive for ear pain and hearing loss. Negative for congestion, sinus pressure, sneezing and sore throat.   Respiratory:  Negative for shortness of breath.   Cardiovascular:  Negative for chest pain.  Gastrointestinal:  Negative for abdominal pain, diarrhea, nausea and vomiting.  Neurological:  Negative for dizziness and headaches.  Hematological:  Positive for adenopathy.  Physical Exam Triage Vital Signs ED Triage Vitals  Encounter Vitals Group     BP 04/05/24 1446 130/76     Girls Systolic BP Percentile --      Girls Diastolic BP Percentile --      Boys Systolic BP Percentile --      Boys Diastolic BP Percentile --      Pulse Rate 04/05/24 1446 (!) 57     Resp 04/05/24 1446 16     Temp 04/05/24 1446 99 F (37.2 C)     Temp Source 04/05/24 1446 Oral     SpO2 04/05/24 1446 96 %     Weight --      Height --      Head Circumference --      Peak Flow --      Pain Score 04/05/24 1445 6     Pain Loc --      Pain Education --      Exclude from Growth Chart --    No data found.  Updated Vital Signs BP 130/76 (BP Location: Left  Arm)   Pulse (!) 57   Temp 99 F (37.2 C) (Oral)   Resp 16   SpO2 96%   Visual Acuity Right Eye Distance:   Left Eye Distance:   Bilateral Distance:    Right Eye Near:   Left Eye Near:    Bilateral Near:     Physical Exam Vitals reviewed.  Constitutional:      General: He is awake.     Appearance: Normal appearance. He is well-developed. He is not ill-appearing.     Comments: Very pleasant male appears stated age in no acute distress  HENT:     Head: Normocephalic and atraumatic.     Right Ear: Tympanic membrane, ear canal and external ear normal. There is impacted cerumen. Tympanic membrane is not erythematous or bulging.     Left Ear: Tympanic membrane, ear canal and external ear normal. Tympanic membrane is not erythematous or bulging.     Ears:     Comments: Indurated nodule and swelling noted right earlobe.  Without bleeding, drainage, fluctuance.  Cerumen impaction noted right ear; cerumen impaction resolved following in office irrigation revealing normal TM    Nose: Nose normal.     Mouth/Throat:     Pharynx: Uvula midline. No oropharyngeal exudate, posterior oropharyngeal erythema or uvula swelling.  Cardiovascular:     Rate and Rhythm: Regular rhythm. Bradycardia present.     Heart sounds: Normal heart sounds, S1 normal and S2 normal. No murmur heard. Pulmonary:     Effort: Pulmonary effort is normal. No accessory muscle usage or respiratory distress.     Breath sounds: Normal breath sounds. No stridor. No wheezing, rhonchi or rales.     Comments: Clear to auscultation bilaterally Abdominal:     General: Bowel sounds are normal.     Palpations: Abdomen is soft.     Tenderness: There is no abdominal tenderness.  Lymphadenopathy:     Head:     Right side of head: No submental, submandibular or tonsillar adenopathy.     Left side of head: No submental, submandibular or tonsillar adenopathy.     Cervical: Cervical adenopathy present.     Right cervical:  Superficial cervical adenopathy present.     Left cervical: No superficial cervical adenopathy.  Neurological:     Mental Status: He is alert.  Psychiatric:        Behavior: Behavior is cooperative.  UC Treatments / Results  Labs (all labs ordered are listed, but only abnormal results are displayed) Labs Reviewed - No data to display  EKG   Radiology No results found.  Procedures Procedures (including critical care time)  Medications Ordered in UC Medications - No data to display  Initial Impression / Assessment and Plan / UC Course  I have reviewed the triage vital signs and the nursing notes.  Pertinent labs & imaging results that were available during my care of the patient were reviewed by me and considered in my medical decision making (see chart for details).     Patient is well-appearing, afebrile, nontoxic, nontachycardic.  He was noted to have a cerumen impaction on exam but this resolved following irrigation in clinic revealing a normal TM without evidence of infection.  He does have a erythematous nodule in his right earlobe we discussed that I am concerned that this is an abscess.  There is no significant fluctuance and we discussed potential risks and benefits of aspiration versus I&D in clinic but patient elected to initiate antibiotics and hold off on any procedure.  He was encouraged to use warm compresses and will start doxycycline  100 mg twice daily for 10 days.  Discussed that he is to avoid prolonged sun exposure while on this medication due to associated photosensitivity.  He does have a history of cysts and so recommend that he follow-up with a dermatologist for further evaluation and management.  He was given the contact information for local provider with instruction to call to schedule an appointment.  If he has any worsening or changing symptoms including rapidly enlarging lesion, fever, increasing pain, nausea/vomiting, abnormal drainage he needs to be  seen emergently.  Strict return precautions given.  All questions answered to patient satisfaction.  Final Clinical Impressions(s) / UC Diagnoses   Final diagnoses:  Abscess of right earlobe  Impacted cerumen of right ear     Discharge Instructions      We were able to remove the wax from your ear.  Your eardrum looks normal but I do think you have an infection in your earlobe.  Use warm compresses on this area and start doxycycline  100 mg twice daily for 10 days.  Stay out of the sun while on this medication.  I do recommend you follow-up with a dermatologist.  Call them to schedule an appointment.  If anything worsens and this lesion becomes larger, more painful, you develop fever, nausea, vomiting you need to be seen immediately.     ED Prescriptions     Medication Sig Dispense Auth. Provider   doxycycline  (VIBRAMYCIN ) 100 MG capsule Take 1 capsule (100 mg total) by mouth 2 (two) times daily. 20 capsule Kamala Kolton K, PA-C      PDMP not reviewed this encounter.   Sherrell Rocky POUR, PA-C 04/05/24 1549

## 2024-04-05 NOTE — Discharge Instructions (Signed)
 We were able to remove the wax from your ear.  Your eardrum looks normal but I do think you have an infection in your earlobe.  Use warm compresses on this area and start doxycycline  100 mg twice daily for 10 days.  Stay out of the sun while on this medication.  I do recommend you follow-up with a dermatologist.  Call them to schedule an appointment.  If anything worsens and this lesion becomes larger, more painful, you develop fever, nausea, vomiting you need to be seen immediately.

## 2024-04-05 NOTE — ED Triage Notes (Signed)
 Pt reports pain and swelling in the right ear x 2 days. Pain radiates to jaw. Ibuprofen  gives some relief.
